# Patient Record
Sex: Male | Born: 1985 | Race: White | Hispanic: No | Marital: Married | Smoking: Former smoker
Health system: Southern US, Community
[De-identification: ages and names within clinical notes are randomized; demographics above are authoritative.]

## PROBLEM LIST (undated history)

## (undated) DIAGNOSIS — Z8489 Family history of other specified conditions: Secondary | ICD-10-CM

## (undated) DIAGNOSIS — R112 Nausea with vomiting, unspecified: Secondary | ICD-10-CM

## (undated) DIAGNOSIS — K219 Gastro-esophageal reflux disease without esophagitis: Secondary | ICD-10-CM

## (undated) DIAGNOSIS — G8929 Other chronic pain: Secondary | ICD-10-CM

## (undated) DIAGNOSIS — M549 Dorsalgia, unspecified: Secondary | ICD-10-CM

## (undated) DIAGNOSIS — F329 Major depressive disorder, single episode, unspecified: Secondary | ICD-10-CM

## (undated) DIAGNOSIS — F32A Depression, unspecified: Secondary | ICD-10-CM

## (undated) DIAGNOSIS — Z9889 Other specified postprocedural states: Secondary | ICD-10-CM

## (undated) HISTORY — PX: HERNIA REPAIR: SHX51

## (undated) HISTORY — PX: BACK SURGERY: SHX140

---

## 2014-06-17 ENCOUNTER — Ambulatory Visit: Payer: No Typology Code available for payment source | Attending: Family Medicine | Admitting: Physical Therapy

## 2014-06-17 DIAGNOSIS — Z87828 Personal history of other (healed) physical injury and trauma: Secondary | ICD-10-CM | POA: Diagnosis not present

## 2014-06-17 DIAGNOSIS — M545 Low back pain: Secondary | ICD-10-CM | POA: Diagnosis present

## 2014-06-17 DIAGNOSIS — M542 Cervicalgia: Secondary | ICD-10-CM | POA: Diagnosis not present

## 2014-06-19 ENCOUNTER — Ambulatory Visit: Payer: No Typology Code available for payment source | Admitting: Physical Therapy

## 2014-06-19 DIAGNOSIS — M545 Low back pain: Secondary | ICD-10-CM | POA: Diagnosis not present

## 2014-06-24 ENCOUNTER — Ambulatory Visit: Payer: No Typology Code available for payment source | Attending: Family Medicine | Admitting: *Deleted

## 2014-06-24 DIAGNOSIS — M545 Low back pain: Secondary | ICD-10-CM | POA: Diagnosis not present

## 2014-06-24 DIAGNOSIS — M542 Cervicalgia: Secondary | ICD-10-CM | POA: Diagnosis not present

## 2014-06-24 DIAGNOSIS — Z87828 Personal history of other (healed) physical injury and trauma: Secondary | ICD-10-CM | POA: Insufficient documentation

## 2014-06-26 ENCOUNTER — Encounter: Payer: No Typology Code available for payment source | Admitting: Physical Therapy

## 2014-06-29 ENCOUNTER — Encounter: Payer: No Typology Code available for payment source | Admitting: Physical Therapy

## 2014-06-30 ENCOUNTER — Encounter: Payer: No Typology Code available for payment source | Admitting: Physical Therapy

## 2015-05-03 ENCOUNTER — Encounter (HOSPITAL_COMMUNITY): Payer: Self-pay | Admitting: Emergency Medicine

## 2015-05-03 ENCOUNTER — Emergency Department (HOSPITAL_COMMUNITY)
Admission: EM | Admit: 2015-05-03 | Discharge: 2015-05-03 | Disposition: A | Discharge status: Home / Self Care | Payer: 59 | Attending: Emergency Medicine | Admitting: Emergency Medicine

## 2015-05-03 DIAGNOSIS — R51 Headache: Secondary | ICD-10-CM | POA: Insufficient documentation

## 2015-05-03 DIAGNOSIS — H109 Unspecified conjunctivitis: Secondary | ICD-10-CM | POA: Insufficient documentation

## 2015-05-03 DIAGNOSIS — H578 Other specified disorders of eye and adnexa: Secondary | ICD-10-CM | POA: Diagnosis present

## 2015-05-03 MED ORDER — TETRACAINE HCL 0.5 % OP SOLN
2.0000 [drp] | Freq: Once | OPHTHALMIC | Status: AC
  Administered 2015-05-03: 2 [drp] via OPHTHALMIC
  Filled 2015-05-03: qty 4

## 2015-05-03 MED ORDER — TOBRAMYCIN 0.3 % OP SOLN
1.0000 [drp] | Freq: Once | OPHTHALMIC | Status: AC
  Administered 2015-05-03: 1 [drp] via OPHTHALMIC
  Filled 2015-05-03: qty 5

## 2015-05-03 NOTE — ED Notes (Signed)
Pt reports bilat eye redness, itching, buning and matting. Pt also c/o headache.

## 2015-05-03 NOTE — ED Provider Notes (Signed)
CSN: [id number]     Arrival date & time 05/03/15  1129 History  By signing my name below, I, Placido Sou, attest that this documentation has been prepared under the direction and in the presence of Addisynn Vassell, PA-C. Electronically Signed: Placido Sou, ED Scribe. 05/03/2015. 1:26 PM.   Chief Complaint  Patient presents with  . Eye Problem   The history is provided by the patient. No language interpreter was used.    HPI Comments: Kyle Hoover is a 29 y.o. male who presents to the Emergency Department complaining of moderate, worsening, right eye irritation with onset 1 day ago. Pt describes the sensation as burning, itching  further noting associated discharge, eye watering, matting and redness with his left eye beginning to show similar symptoms this morning. He also notes an associated, mild, intermitting, frontal HA. He denies wearing prescription eyewear, recent illnesses or any recent eye trauma. Pt denies photophobia, fever, rhinorrhea, cough, sneezing or any other cold-like symptoms.   No past medical history on file. No past surgical history on file. No family history on file. Social History  Substance Use Topics  . Smoking status: Not on file  . Smokeless tobacco: Not on file  . Alcohol Use: Not on file    Review of Systems  HENT: Negative for congestion, postnasal drip, rhinorrhea, sneezing and sore throat.   Eyes: Positive for pain, discharge, redness and itching. Negative for photophobia and visual disturbance.  Neurological: Positive for headaches.   Allergies  Review of patient's allergies indicates not on file.  Home Medications   Prior to Admission medications   Not on File   BP 117/72 mmHg  Pulse 81  Temp(Src) 97.8 F (36.6 C) (Oral)  Resp 14  Ht 6' (1.829 m)  Wt 97.523 kg  BMI 29.15 kg/m2  SpO2 98% Physical Exam  Constitutional: He is oriented to person, place, and time. He appears well-developed and well-nourished.  HENT:  Head:  Normocephalic and atraumatic.  Right Ear: Hearing, tympanic membrane, external ear and ear canal normal.  Left Ear: Hearing, tympanic membrane, external ear and ear canal normal.  Mouth/Throat: Uvula is midline and oropharynx is clear and moist. No oropharyngeal exudate.  Eyes: EOM are normal. Lids are everted and swept, no foreign bodies found. Right eye exhibits discharge. Right eye exhibits no chemosis. Left eye exhibits discharge. Left eye exhibits no chemosis. Right conjunctiva is injected. Right conjunctiva has no hemorrhage. Left conjunctiva has no hemorrhage.  Slit lamp exam:      The right eye shows no corneal abrasion, no corneal ulcer and no fluorescein uptake.       The left eye shows no corneal abrasion.  Conjunctivae Injected on the right; mild bilateral exudate present; no ecchymosis   Neck: Normal range of motion. No tracheal deviation present.  No cervical lymphadenopathy   Cardiovascular: Normal rate and regular rhythm.   Pulmonary/Chest: Effort normal. No respiratory distress.  Musculoskeletal: Normal range of motion.  Neurological: He is alert and oriented to person, place, and time.  Skin: Skin is warm and dry. He is not diaphoretic.  Psychiatric: He has a normal mood and affect. His behavior is normal.  Nursing note and vitals reviewed.  ED Course  Procedures  DIAGNOSTIC STUDIES: Oxygen Saturation is 98% on RA, normal by my interpretation.    COORDINATION OF CARE: 12:42 PM Pt presents due to worsening eye irritation. Discussed next steps with pt including a fluorescin exam and reevaluation based on the findings. Pt agreed to plan.  SLIT LAMP EXAM: Tetracaine 2 drops used.  Lids everted and swept for exam, no evidence of foreign body.  Conjunctivae: Injected on the right Cornea: No evidence of abrasion.  EOM: Intact  Pupils: PERRL  Fluorescein uptake: negative   Patient tolerated procedure well without immediate complications.  Labs Review Labs Reviewed -  No data to display  Imaging Review No results found.   EKG Interpretation None      MDM                        Visual Acuity  Right Eye Distance: 20/30 Left Eye Distance: 20/30 Bilateral Distance: 20/15  Right Eye Near:   Left Eye Near:    Bilateral Near:  20/15  Final diagnoses:  Bilateral conjunctivitis    Pt is well appearing.  nml fundoscopic exam.  Dispensed tobramycin, pt agrees to warm compresses and OTC lubrication eye drops.  Referral given for ophtho  I personally performed the services described in this documentation, which was scribed in my presence. The recorded information has been reviewed and is accurate.    Pauline Ausammy Lear Carstens, PA-C 05/04/15 2141  Eber HongBrian Miller, MD 05/05/15 [phone]

## 2015-05-03 NOTE — Discharge Instructions (Signed)
Bacterial Conjunctivitis Bacterial conjunctivitis (commonly called pink eye) is redness, soreness, or puffiness (inflammation) of the white part of your eye. It is caused by a germ called bacteria. These germs can easily spread from person to person (contagious). Your eye often will become red or pink. Your eye may also become irritated, watery, or have a thick discharge.  HOME CARE   Apply a cool, clean washcloth over closed eyelids. Do this for 10-20 minutes, 3-4 times a day while you have pain.  Gently wipe away any fluid coming from the eye with a warm, wet washcloth or cotton ball.  Wash your hands often with soap and water. Use paper towels to dry your hands.  Do not share towels or washcloths.  Change or wash your pillowcase every day.  Do not use eye makeup until the infection is gone.  Do not use machines or drive if your vision is blurry.  Stop using contact lenses. Do not use them again until your doctor says it is okay.  Do not touch the tip of the eye drop bottle or medicine tube with your fingers when you put medicine on the eye. GET HELP RIGHT AWAY IF:   Your eye is not better after 3 days of starting your medicine.  You have a yellowish fluid coming out of the eye.  You have more pain in the eye.  Your eye redness is spreading.  Your vision becomes blurry.  You have a fever or lasting symptoms for more than 2-3 days.  You have a fever and your symptoms suddenly get worse.  You have pain in the face.  Your face gets red or puffy (swollen). MAKE SURE YOU:   Understand these instructions.  Will watch this condition.  Will get help right away if you are not doing well or get worse.   This information is not intended to replace advice given to you by your health care provider. Make sure you discuss any questions you have with your health care provider.   Document Released: 02/15/2008 Document Revised: 04/24/2012 Document Reviewed: 01/12/2012 Elsevier  Interactive Patient Education 2016 Elsevier Inc.  

## 2015-09-12 ENCOUNTER — Emergency Department (HOSPITAL_COMMUNITY)
Admission: EM | Admit: 2015-09-12 | Discharge: 2015-09-12 | Disposition: A | Discharge status: Home / Self Care | Payer: 59 | Attending: Emergency Medicine | Admitting: Emergency Medicine

## 2015-09-12 ENCOUNTER — Encounter (HOSPITAL_COMMUNITY): Payer: Self-pay | Admitting: Emergency Medicine

## 2015-09-12 DIAGNOSIS — M5440 Lumbago with sciatica, unspecified side: Secondary | ICD-10-CM | POA: Insufficient documentation

## 2015-09-12 DIAGNOSIS — M545 Low back pain: Secondary | ICD-10-CM | POA: Diagnosis present

## 2015-09-12 DIAGNOSIS — J020 Streptococcal pharyngitis: Secondary | ICD-10-CM | POA: Insufficient documentation

## 2015-09-12 DIAGNOSIS — Z79899 Other long term (current) drug therapy: Secondary | ICD-10-CM | POA: Diagnosis not present

## 2015-09-12 DIAGNOSIS — Z87891 Personal history of nicotine dependence: Secondary | ICD-10-CM | POA: Insufficient documentation

## 2015-09-12 LAB — RAPID STREP SCREEN (MED CTR MEBANE ONLY): STREPTOCOCCUS, GROUP A SCREEN (DIRECT): POSITIVE — AB

## 2015-09-12 MED ORDER — ONDANSETRON HCL 4 MG/2ML IJ SOLN
4.0000 mg | Freq: Once | INTRAMUSCULAR | Status: AC
  Administered 2015-09-12: 4 mg via INTRAVENOUS
  Filled 2015-09-12: qty 2

## 2015-09-12 MED ORDER — OXYCODONE-ACETAMINOPHEN 5-325 MG PO TABS: 1.0000 | ORAL_TABLET | ORAL | Status: DC | PRN

## 2015-09-12 MED ORDER — METHYLPREDNISOLONE SODIUM SUCC 125 MG IJ SOLR
125.0000 mg | Freq: Once | INTRAMUSCULAR | Status: AC
  Administered 2015-09-12: 125 mg via INTRAVENOUS
  Filled 2015-09-12: qty 2

## 2015-09-12 MED ORDER — HYDROMORPHONE HCL 1 MG/ML IJ SOLN
1.0000 mg | Freq: Once | INTRAMUSCULAR | Status: AC
  Administered 2015-09-12: 1 mg via INTRAVENOUS
  Filled 2015-09-12: qty 1

## 2015-09-12 MED ORDER — SODIUM CHLORIDE 0.9 % IV BOLUS (SEPSIS)
1000.0000 mL | Freq: Once | INTRAVENOUS | Status: AC
  Administered 2015-09-12: 1000 mL via INTRAVENOUS

## 2015-09-12 MED ORDER — PREDNISONE 50 MG PO TABS: ORAL_TABLET | ORAL | Status: DC

## 2015-09-12 MED ORDER — CYCLOBENZAPRINE HCL 10 MG PO TABS: 10.0000 mg | ORAL_TABLET | Freq: Two times a day (BID) | ORAL | Status: DC | PRN

## 2015-09-12 MED ORDER — KETOROLAC TROMETHAMINE 30 MG/ML IJ SOLN
30.0000 mg | Freq: Once | INTRAMUSCULAR | Status: AC
  Administered 2015-09-12: 30 mg via INTRAVENOUS
  Filled 2015-09-12: qty 1

## 2015-09-12 MED ORDER — PENICILLIN G BENZATHINE 1200000 UNIT/2ML IM SUSP
1.2000 10*6.[IU] | Freq: Once | INTRAMUSCULAR | Status: AC
  Administered 2015-09-12: 1.2 10*6.[IU] via INTRAMUSCULAR
  Filled 2015-09-12: qty 2

## 2015-09-12 NOTE — Discharge Instructions (Signed)
Your swab was positive for strep tonight. He received a shot of penicillin. Prescription for pain medicine, prednisone, muscle relaxer.  Follow-up with neurosurgeon

## 2015-09-12 NOTE — ED Notes (Signed)
Pt c/o bilateral lower back pain and sore throat. 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 Pt HR 126.

## 2015-09-12 NOTE — ED Provider Notes (Signed)
CSN: [id number]     Arrival date & time 09/12/15  1357 History   First MD Initiated Contact with Patient 09/12/15 1454     Chief Complaint  Patient presents with  . Back Pain     (Consider location/radiation/quality/duration/timing/severity/associated sxs/prior Treatment) HPI..... Back pain for several days. Patient has had 2 back surgeries in 2016 by Dr. Gerlene FeeKritzer.  First surgery in August, second surgery in December. Patient is uncertain exactly what surgery he had and information is not in EPIC. Additionally patient complains of sore throat and headache. No stiff neck. He is ambulatory. No bowel or bladder incontinence. 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 Severity is moderate.  Past Medical History  Diagnosis Date  . Back pain    Past Surgical History  Procedure Laterality Date  . Back surgery     Family History  Problem Relation Age of Onset  . Stroke Mother   . Heart failure Mother   . Asthma Mother   . Asthma Father   . Heart failure Father    Social History  Substance Use Topics  . Smoking status: Former Games developermoker  . Smokeless tobacco: Current User    Types: Snuff  . Alcohol Use: Yes     Comment: rarely    Review of Systems  All other systems reviewed and are negative.     Allergies  Review of patient's allergies indicates no known allergies.  Home Medications   Prior to Admission medications   Medication Sig Start Date End Date Taking? Authorizing Provider  cetirizine (ZYRTEC) 10 MG tablet Take 10 mg by mouth daily.   Yes Historical Provider, MD  nabumetone (RELAFEN) 500 MG tablet Take 500 mg by mouth 2 (two) times daily.   Yes Historical Provider, MD  omeprazole (PRILOSEC) 20 MG capsule Take 20 mg by mouth daily.   Yes Historical Provider, MD  cyclobenzaprine (FLEXERIL) 10 MG tablet Take 1 tablet (10 mg total) by mouth 2 (two) times daily as needed for muscle spasms. 09/12/15   Donnetta HutchingBrian Rigo Letts, MD  oxyCODONE-acetaminophen (PERCOCET) 5-325 MG  tablet Take 1-2 tablets by mouth every 4 (four) hours as needed. 09/12/15   Donnetta HutchingBrian Khloei Spiker, MD  predniSONE (DELTASONE) 50 MG tablet 1 tablet for 6 days, one half tablet for 6 days 09/12/15   Donnetta HutchingBrian Santosha Jividen, MD   BP 128/79 mmHg  Pulse 116  Temp(Src) 98.8 F (37.1 C) (Oral)  Resp 18  Ht 5\' 11"  (1.803 m)  Wt 217 lb (98.431 kg)  BMI 30.28 kg/m2  SpO2 98% Physical Exam  Constitutional: He is oriented to person, place, and time. He appears well-developed and well-nourished.  HENT:  Head: Normocephalic and atraumatic.  Oral pharyngeal area erythematous  Eyes: Conjunctivae and EOM are normal. Pupils are equal, round, and reactive to light.  Neck: Normal range of motion. Neck supple.  Cardiovascular: Normal rate and regular rhythm.   Pulmonary/Chest: Effort normal and breath sounds normal.  Abdominal: Soft. Bowel sounds are normal.  Musculoskeletal: Normal range of motion.  Paraspinous tenderness lumbar spine. Pain with straight leg raise bilaterally.  There is no evidence of an epidural abscess  Neurological: He is alert and oriented to person, place, and time.  Skin: Skin is warm and dry.  Psychiatric: He has a normal mood and affect. His behavior is normal.  Nursing note and vitals reviewed.   ED Course  Procedures (including critical care time) Labs Review Labs Reviewed  RAPID STREP SCREEN (NOT AT [facility]) - Abnormal; Notable for  the following:    Streptococcus, Group A Screen (Direct) POSITIVE (*)    All other components within normal limits    Imaging Review No results found. I have personally reviewed and evaluated these images and lab results as part of my medical decision-making.   EKG Interpretation None      MDM   Final diagnoses:  Low back pain, unspecified back pain laterality, with sciatica presence unspecified  Strep pharyngitis    Patient has had 2 back surgeries in 2016 on his lumbar spine. Rapid strep positive for strep. Rx Bicillin LA 1.2 million units IM.  Discharge medications percocet, prednisone, flexeril 10 mg.  Patient will follow-up with his neurosurgeon    Donnetta Hutching, MD 09/12/15 2122

## 2015-09-12 NOTE — ED Notes (Signed)
Pt states understanding of care given and follow up instructions.  Ambulated from ED  

## 2015-09-27 ENCOUNTER — Institutional Professional Consult (permissible substitution): Payer: 59 | Admitting: Neurology

## 2015-10-07 ENCOUNTER — Institutional Professional Consult (permissible substitution): Payer: 59 | Admitting: Neurology

## 2015-10-21 ENCOUNTER — Encounter: Payer: Self-pay | Admitting: Neurology

## 2015-10-21 ENCOUNTER — Ambulatory Visit (INDEPENDENT_AMBULATORY_CARE_PROVIDER_SITE_OTHER): Payer: 59 | Admitting: Neurology

## 2015-10-21 VITALS — BP 130/88 | HR 86 | Resp 20 | Ht 71.0 in | Wt 206.0 lb

## 2015-10-21 DIAGNOSIS — R51 Headache: Secondary | ICD-10-CM | POA: Diagnosis not present

## 2015-10-21 DIAGNOSIS — G4733 Obstructive sleep apnea (adult) (pediatric): Secondary | ICD-10-CM

## 2015-10-21 DIAGNOSIS — G4711 Idiopathic hypersomnia with long sleep time: Secondary | ICD-10-CM | POA: Insufficient documentation

## 2015-10-21 DIAGNOSIS — R519 Headache, unspecified: Secondary | ICD-10-CM

## 2015-10-21 NOTE — Addendum Note (Signed)
Addended by: Melvyn NovasHMEIER, Sherrica Niehaus on: 10/21/2015 03:49 PM   Modules accepted: Orders

## 2015-10-21 NOTE — Progress Notes (Signed)
SLEEP MEDICINE CLINIC   Provider:  Melvyn Novas, M D  Referring Provider: Lianne Moris, PA-C Primary Care Physician:  No primary care provider on file.  Chief Complaint  Patient presents with  . New Patient (Initial Visit)    snores at night, never had sleep study, rm 10, with wife    HPI:  Kyle Hoover is a 30 y.o. male , seen here as a referral from [place]   [facility] for Evaluation of non-restorative non-refreshing sleep.   Chief complaint according to patient : Kyle Hoover reports that he has some nights gotten " enough sleep"- more than 8 hours - and still wakes up not feeling as if he had slept sufficiently. He feels fatigued in daytime he has also trouble that his sleep feels restless and disrupted or fragmented. He is highly fatigued in daytime. Kyle Hoover reports that he usually works in daytime sometimes longer hours but he is not working overnight or third shift. 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  The patient takes currently the following medications, omeprazole 1 capsule in the evening which started on 09/17/2015.  Sleep habits are as follows: He reports feeling a lot in fatigue when he comes home after work. He feels physically exhausted. He usually returns home between 5 and 6 PM, and he feels ready to sleep. When he is not mentally stimulated or physically active his fatigue is even more noticeable. He usually needs about half an hour in bed before he can enter sleep, and goes to his bedroom around 10 PM. He turns over but he doesn't really wake up for period of time at night. He does not have bathroom breaks at night, his wife reports that he snores loudly every night which keeps her awake. He may stop snoring for couple of minutes and then has a very staggering irregular breathing rhythm. The bedroom is described as core, quiet and dark, and the couple shares the bed. The 49-year-old boy sometimes will be in the same bed. [name] sleeps usually on one pillow, and he usually prefers supine sleep but has already noted that he has more apnea or snoring in that position. Alternatively, he likes to sleep prone. The patient rises usually around 5 AM and relies on an alarm., after an estimated overnight sleep time of 6-7 hours. He has reported morning headaches in the morning.    Sleep medical history and family sleep history:  Kyle Hoover suffers from obstructive sleep apnea was diagnosed. He he has been prescribed a CPAP .  Social history:  Married with a toddler son, he uses dip tobacco, snuff tobacco. He drinks very rarely alcohol, he drinks water and avoids caffeinated beverages.   Review of Systems: Out of a complete 14 system review, the patient complains of only the following symptoms, and all other reviewed systems are negative.   Epworth score  12 , Fatigue severity score 40  , depression score 2/15   Social History   Social History  . Marital Status: Married    Spouse Name: N/A  . Number of Children: N/A  . Years of Education: N/A   Occupational History  . Not on file.   Social History Main Topics  . Smoking status: Former Games developer  . Smokeless tobacco: Current User    Types: Snuff  . Alcohol Use: Yes     Comment: rarely  . Drug Use: No  . Sexual Activity: Not on file   Other  Topics Concern  . Not on file   Social History Narrative    Family History  Problem Relation Age of Onset  . Stroke Mother   . Heart failure Mother   . Asthma Mother   . Asthma Father   . Heart failure Father     Past Medical History  Diagnosis Date  . Back pain     Past Surgical History  Procedure Laterality Date  . Back surgery      Current Outpatient Prescriptions  Medication Sig Dispense Refill  . cetirizine (ZYRTEC) 10 MG tablet Take 10 mg by mouth daily.    Marland Kitchen omeprazole (PRILOSEC) 20 MG capsule Take 20 mg by mouth daily.     No current facility-administered  medications for this visit.    Allergies as of 10/21/2015  . (No Known Allergies)    Vitals: BP 130/88 mmHg  Pulse 86  Resp 20  Ht  (1.803 m)  Wt 206 lb (93.441 kg)  BMI 28.74 kg/m2 Last Weight:  Wt Readings from Last 1 Encounters:  10/21/15 206 lb (93.441 kg)   ZOX:WRUE mass index is 28.74 kg/(m^2).     Last Height:   Ht Readings from Last 1 Encounters:  10/21/15  (1.803 m)    Physical exam:  General: The patient is awake, alert and appears not in acute distress. The patient is well groomed. Head: Normocephalic, atraumatic. Neck is supple. Mallampati 4  17.5    inches of neck circumference Nasal airflow patent ,  Retrognathia is not seen.  Cardiovascular:  Regular rate and rhythm  without  murmurs or carotid bruit, and without distended neck veins. Respiratory: Lungs are clear to auscultation. Skin:  Without evidence of edema, or rash Trunk: The patient's posture is erect   Neurologic exam : The patient is awake and alert, oriented to place and time.   Memory subjective described as intact.  Attention span & concentration ability appears normal.  Speech is fluent,  without   dysarthria, dysphonia or aphasia.  Mood and affect are appropriate.  Cranial nerves: Pupils are equal and briskly reactive to light. Funduscopic exam without  evidence of pallor or edema. Extraocular movements  in vertical and horizontal planes intact and without nystagmus. Visual fields by finger perimetry are intact.Hearing to finger rub intact. Facial sensation intact to fine touch.Facial motor strength is symmetric and tongue and uvula move midline. Shoulder shrug was symmetrical.  Motor exam:  Normal tone, muscle bulk and symmetric strength in all extremities. Sensory:  Fine touch, pinprick and vibration were tested in all extremities. Proprioception tested in the upper extremities was normal. Coordination: Rapid alternating movements in the fingers/hands was normal. Finger-to-nose  maneuver  normal without evidence of ataxia, dysmetria or tremor. Gait and station: Patient walks without assistive device and is able unassisted to climb up to the exam table. Strength within normal limits. Stance is stable and normal.   Deep tendon reflexes: in the  upper and lower extremities are symmetric and intact.   The patient was advised of the nature of the diagnosed sleep disorder , the treatment options and risks for general a health and wellness arising from not treating the condition.  I spent more than 40 minutes of face to face time with the patient. Greater than 50% of time was spent in counseling and coordination of care. We have discussed the diagnosis and differential and I answered the patient's questions.     Assessment:  After physical and neurologic examination, review of  laboratory studies,  Personal review of imaging studies, reports of other /same  Imaging studies ,  Results of polysomnography/ neurophysiology testing and pre-existing records as far as provided in visit., my assessment is   1) Kyle Hoover's wife has reported that he has snoring and she has witnessed apneas by description. He does have a high-grade Mallampati and a larger neck circumference, his overall body mass index is elevated but she is by no means morbidly obese. He is also of muscular build. He does not have abnormalities of heart rhythm, blood pressure, and there are no bruits. His excessive daytime sleepiness is in contrast to needing a little time to fall asleep when he is finally at home and expected to go to bed. He attributes this partially to the physical exhaustion and feels sometimes most tired when driving home. With the above description would like this patient to attend a split-night polysomnography with the ultimate goal to diagnose the degree of sleep apnea and the best associated treatment options. If hypoxemia or hypercapnia are not present this patient would be a fine candidate for a  dental device. I will order a attended sleep study as I want to have a capnography parallel please note that the patient has reported morning headaches after sleep, but not episodic cluster headaches that wake him out of sleep. He is excessively fatigued which also can be related to CO2 retention. He does not report vivid dreams dream intrusion or  Hallucinations, there is no evidence of cataplexy either. Based on this description I would not need him to be evaluated for narcolepsy.   Plan:  Treatment plan and additional workup :  SPLIT night  with capnography.   RV after sleep study.     Porfirio Mylararmen Keelin Neville MD  10/21/2015   CC: [facility]    Lianne MorisErin Carroll, Pa-c [address] [place], [place] [id number]

## 2015-11-18 ENCOUNTER — Encounter (INDEPENDENT_AMBULATORY_CARE_PROVIDER_SITE_OTHER): Payer: 59 | Admitting: Neurology

## 2015-11-18 DIAGNOSIS — G4733 Obstructive sleep apnea (adult) (pediatric): Secondary | ICD-10-CM

## 2015-12-03 ENCOUNTER — Telehealth: Payer: Self-pay | Admitting: Neurology

## 2015-12-03 NOTE — Telephone Encounter (Signed)
Pt's wife called and states he  has had study but has not been called about results.Please call

## 2015-12-06 NOTE — Telephone Encounter (Signed)
I spoke to pt and advised him that his sleep study revealed snoring, but did not reveal significant apnea. I advised pt that the snoring can be addressed by a dental device but the pt declined any therapy for his snoring at this time. I advised pt that weight loss is recommended. Pt verbalized understanding of results. Pt declined a follow up appt with Dr. Vickey Hugerohmeier at this time. Pt asked that I fax a copy of the sleep study to Lianne MorisErin Carroll, PA (referring provider). Pt had no questions at this time but was encouraged to call back if questions arise.

## 2015-12-07 ENCOUNTER — Other Ambulatory Visit: Payer: Self-pay

## 2015-12-07 DIAGNOSIS — R51 Headache: Secondary | ICD-10-CM

## 2015-12-07 DIAGNOSIS — G4711 Idiopathic hypersomnia with long sleep time: Secondary | ICD-10-CM

## 2015-12-07 DIAGNOSIS — R519 Headache, unspecified: Secondary | ICD-10-CM

## 2015-12-07 DIAGNOSIS — G4733 Obstructive sleep apnea (adult) (pediatric): Secondary | ICD-10-CM

## 2016-08-29 ENCOUNTER — Other Ambulatory Visit: Payer: Self-pay | Admitting: Neurological Surgery

## 2016-08-29 ENCOUNTER — Telehealth: Payer: Self-pay | Admitting: Surgery

## 2016-08-29 NOTE — Telephone Encounter (Signed)
-----   Message from Phillips Odor, RN sent at 08/29/2016  4:45 PM EDT ----- Regarding: needs appt. with Dr. Myra Gianotti Please schedule for a new pt. consult with Dr. Myra Gianotti prior to ALIF scheduled 09/29/16; please remind pt. To bring copy of LS spine film to appt.

## 2016-08-29 NOTE — Telephone Encounter (Signed)
Scheduled 5/2 @ 3pm. Left message to confirm.

## 2016-08-31 ENCOUNTER — Other Ambulatory Visit: Payer: Self-pay

## 2016-09-13 ENCOUNTER — Encounter: Payer: Self-pay | Admitting: Surgery

## 2016-09-18 NOTE — Pre-Procedure Instructions (Signed)
Harim Bi  09/18/2016      [facility] - [place], [place] - [address] [place] [address] Phone: [phone] Fax: [phone]  [facility] [address] [place], [place] [address] [phone] [place] [address] Phone: [phone] Fax: [phone]    Your procedure is scheduled on Fri, May 11 @ 7:30 AM  Report to [facility] Admitting at 5:30 AM  Call this number if you have problems the morning of surgery:  [phone]   Remember:  Do not eat food or drink liquids after midnight.  Take these medicines the morning of surgery with A SIP OF WATER Zyrtec(Cetirizine),Pain Pill(if needed),and Omeprazole(Protonix)              No Goody's,BC's,Aleve,Advil,Motrin,Ibuprofen,Aspirin,Fish Oil,or any Herbal Medications.    Do not wear jewelry.  Do not wear lotions, powders,colognes, or deoderant.  Men may shave face and neck.  Do not bring valuables to the hospital.  [facility] is not responsible for any belongings or valuables.  Contacts, dentures or bridgework may not be worn into surgery.  Leave your suitcase in the car.  After surgery it may be brought to your room.  For patients admitted to the hospital, discharge time will be determined by your treatment team.  Patients discharged the day of surgery will not be allowed to drive home.    [facility]  Before surgery, you can play an important role.  Because skin is not sterile, your skin needs to be as free of germs as possible.  You can reduce the number of germs on you skin by washing with CHG (chlorahexidine gluconate) soap before surgery.  CHG is an antiseptic cleaner which kills germs and bonds with the skin to continue killing germs even after washing.  Please DO NOT use if you have an allergy to CHG or antibacterial soaps.  If your skin becomes reddened/irritated stop using the CHG and inform your nurse when you arrive at Short  Stay.  Do not shave (including legs and underarms) for at least 48 hours prior to the first CHG shower.  You may shave your face.  Please follow these instructions carefully:   1.  Shower with CHG Soap the night before surgery and the                                morning of Surgery.  2.  If you choose to wash your hair, wash your hair first as usual with your       normal shampoo.  3.  After you shampoo, rinse your hair and body thoroughly to remove the                      Shampoo.  4.  Use CHG as you would any other liquid soap.  You can apply chg directly       to the skin and wash gently with scrungie or a clean washcloth.  5.  Apply the CHG Soap to your body ONLY FROM THE NECK DOWN.        Do not use on open wounds or open sores.  Avoid contact with your eyes,       ears, mouth and genitals (private parts).  Wash genitals (private parts)       with your normal soap.  6.  Wash  thoroughly, paying special attention to the area where your surgery        will be performed.  7.  Thoroughly rinse your body with warm water from the neck down.  8.  DO NOT shower/wash with your normal soap after using and rinsing off       the CHG Soap.  9.  Pat yourself dry with a clean towel.            10.  Wear clean pajamas.            11.  Place clean sheets on your bed the night of your first shower and do not        sleep with pets.  Day of Surgery  Do not apply any lotions/deoderants the morning of surgery.  Please wear clean clothes to the hospital/surgery center.    Please read over the following fact sheets that you were given. Pain Booklet, Coughing and Deep Breathing, MRSA Information and Surgical Site Infection Prevention

## 2016-09-19 ENCOUNTER — Encounter (HOSPITAL_COMMUNITY): Payer: Self-pay

## 2016-09-19 ENCOUNTER — Ambulatory Visit (HOSPITAL_COMMUNITY)
Admission: RE | Admit: 2016-09-19 | Discharge: 2016-09-19 | Disposition: A | Discharge status: Home / Self Care | Payer: 59 | Source: Ambulatory Visit | Attending: Neurological Surgery | Admitting: Neurological Surgery

## 2016-09-19 ENCOUNTER — Encounter (HOSPITAL_COMMUNITY)
Admission: RE | Admit: 2016-09-19 | Discharge: 2016-09-19 | Disposition: A | Discharge status: Home / Self Care | Payer: 59 | Source: Ambulatory Visit | Attending: Neurological Surgery | Admitting: Neurological Surgery

## 2016-09-19 DIAGNOSIS — M5126 Other intervertebral disc displacement, lumbar region: Secondary | ICD-10-CM | POA: Insufficient documentation

## 2016-09-19 DIAGNOSIS — Z01812 Encounter for preprocedural laboratory examination: Secondary | ICD-10-CM | POA: Insufficient documentation

## 2016-09-19 DIAGNOSIS — Z01810 Encounter for preprocedural cardiovascular examination: Secondary | ICD-10-CM | POA: Insufficient documentation

## 2016-09-19 HISTORY — DX: Gastro-esophageal reflux disease without esophagitis: K21.9

## 2016-09-19 HISTORY — DX: Other chronic pain: G89.29

## 2016-09-19 HISTORY — DX: Dorsalgia, unspecified: M54.9

## 2016-09-19 HISTORY — DX: Family history of other specified conditions: Z84.89

## 2016-09-19 LAB — CBC WITH DIFFERENTIAL/PLATELET
BASOS ABS: 0 10*3/uL (ref 0.0–0.1)
BASOS PCT: 1 %
EOS PCT: 4 %
Eosinophils Absolute: 0.3 10*3/uL (ref 0.0–0.7)
HEMATOCRIT: 45.9 % (ref 39.0–52.0)
Hemoglobin: 15.9 g/dL (ref 13.0–17.0)
Lymphocytes Relative: 26 %
Lymphs Abs: 1.6 10*3/uL (ref 0.7–4.0)
MCH: 30.6 pg (ref 26.0–34.0)
MCHC: 34.6 g/dL (ref 30.0–36.0)
MCV: 88.4 fL (ref 78.0–100.0)
MONO ABS: 0.5 10*3/uL (ref 0.1–1.0)
MONOS PCT: 8 %
Neutro Abs: 3.7 10*3/uL (ref 1.7–7.7)
Neutrophils Relative %: 61 %
PLATELETS: 283 10*3/uL (ref 150–400)
RBC: 5.19 MIL/uL (ref 4.22–5.81)
RDW: 12.3 % (ref 11.5–15.5)
WBC: 6 10*3/uL (ref 4.0–10.5)

## 2016-09-19 LAB — BASIC METABOLIC PANEL
ANION GAP: 7 (ref 5–15)
BUN: 13 mg/dL (ref 6–20)
CALCIUM: 9.4 mg/dL (ref 8.9–10.3)
CO2: 23 mmol/L (ref 22–32)
CREATININE: 0.81 mg/dL (ref 0.61–1.24)
Chloride: 108 mmol/L (ref 101–111)
GFR calc Af Amer: >60 mL/min (ref >60)
GLUCOSE: 95 mg/dL (ref 65–99)
POTASSIUM: 4.4 mmol/L (ref 3.5–5.1)
SODIUM: 138 mmol/L (ref 135–145)

## 2016-09-19 LAB — TYPE AND SCREEN
ABO/RH(D): A POS
ANTIBODY SCREEN: NEGATIVE

## 2016-09-19 LAB — SURGICAL PCR SCREEN
MRSA, PCR: NEGATIVE
STAPHYLOCOCCUS AUREUS: POSITIVE — AB

## 2016-09-19 LAB — PROTIME-INR
INR: 1
Prothrombin Time: 13.2 seconds (ref 11.4–15.2)

## 2016-09-19 LAB — ABO/RH: ABO/RH(D): A POS

## 2016-09-19 MED ORDER — CHLORHEXIDINE GLUCONATE 4 % EX LIQD: 60.0000 mL | Freq: Once | CUTANEOUS | Status: DC

## 2016-09-19 MED ORDER — CHLORHEXIDINE GLUCONATE CLOTH 2 % EX PADS: 6.0000 | MEDICATED_PAD | Freq: Once | CUTANEOUS | Status: DC

## 2016-09-19 NOTE — Progress Notes (Signed)
Cardiologist denies  Medical Md is with [facility]  Echo denies  Stress test denies  Heart cath denies

## 2016-09-19 NOTE — Progress Notes (Signed)
Mupirocin script called into [facility] in [place].

## 2016-09-20 ENCOUNTER — Ambulatory Visit (INDEPENDENT_AMBULATORY_CARE_PROVIDER_SITE_OTHER): Payer: 59 | Admitting: Surgery

## 2016-09-20 ENCOUNTER — Encounter: Payer: Self-pay | Admitting: Surgery

## 2016-09-20 VITALS — BP 112/77 | HR 82 | Temp 97.5°F | Resp 20 | Ht 71.0 in | Wt 207.5 lb

## 2016-09-20 DIAGNOSIS — M5440 Lumbago with sciatica, unspecified side: Secondary | ICD-10-CM

## 2016-09-20 NOTE — Progress Notes (Signed)
[facility] and [facility] Specialist of [place]  Patient name: Kyle Hoover MRN: [id number] DOB: [birth year] Sex: male   REFERRING PROVIDER:    Dr. Yetta Barre   REASON FOR CONSULT:    Back pain  HISTORY OF PRESENT ILLNESS:   Kyle Hoover is a 31 y.o. male, who is Referred today for discussions regarding anterior exposure for back instrumentation at the L5-S1 disc space.  The patient has previous undergone microdiscectomy.  Since October of last year he has had a return of bilateral radicular pain.  Instrumentation at the L5-S1 disc space has been recommended.  The patient denies having had any abdominal surgery.  He does take allergy medication and medication for reflux.  He has a history of tobacco abuse.  PAST MEDICAL HISTORY    Past Medical History:  Diagnosis Date  . Chronic back pain   . Family history of adverse reaction to anesthesia    dad gets sick  . GERD (gastroesophageal reflux disease)    takes Omeprazole daily     FAMILY HISTORY   Family History  Problem Relation Age of Onset  . Stroke Mother   . Heart failure Mother   . Asthma Mother   . Asthma Father   . Heart failure Father     SOCIAL HISTORY:   Social History   Social History  . Marital status: Married    Spouse name: N/A  . Number of children: N/A  . Years of education: N/A   Occupational History  . Not on file.   Social History Main Topics  . Smoking status: Former Games developer  . Smokeless tobacco: Current User    Types: Snuff     Comment: quit smoking 11 yrs ago  . Alcohol use No  . Drug use: No  . Sexual activity: Not on file   Other Topics Concern  . Not on file   Social History Narrative  . No narrative on file    ALLERGIES:    No Known Allergies  CURRENT MEDICATIONS:    Current Outpatient Prescriptions  Medication Sig Dispense Refill  . cetirizine (ZYRTEC) 10 MG tablet Take 10 mg by mouth daily as needed for allergies.     Marland Kitchen  HYDROcodone-acetaminophen (NORCO/VICODIN) 5-325 MG tablet Take 1 tablet by mouth at bedtime as needed for moderate pain.    Marland Kitchen omeprazole (PRILOSEC) 20 MG capsule Take 40 mg by mouth daily.      No current facility-administered medications for this visit.     REVIEW OF SYSTEMS:    denotes positive finding,  denotes negative finding Cardiac  Comments:  Chest pain or chest pressure:    Shortness of breath upon exertion:    Short of breath when lying flat:    Irregular heart rhythm:        Vascular    Pain in calf, thigh, or hip brought on by ambulation:    Pain in feet at night that wakes you up from your sleep:     Blood clot in your veins:    Leg swelling:         Pulmonary    Oxygen at home:    Productive cough:     Wheezing:         Neurologic    Sudden weakness in arms or legs:     Sudden numbness in arms or legs:     Sudden onset of difficulty speaking or slurred speech:    Temporary loss of vision in one eye:  Problems with dizziness:         Gastrointestinal    Blood in stool:      Vomited blood:         Genitourinary    Burning when urinating:     Blood in urine:        Psychiatric    Major depression:         Hematologic    Bleeding problems:    Problems with blood clotting too easily:        Skin    Rashes or ulcers:        Constitutional    Fever or chills:     PHYSICAL EXAM:   Vitals:   09/20/16 1440  BP: 112/77  Pulse: 82  Resp: 20  Temp: 97.5 F (36.4 C)  TempSrc: Oral  SpO2: 98%  Weight: 207 lb 8 oz (94.1 kg)  Height:  (1.803 m)    GENERAL: The patient is a well-nourished male, in no acute distress. The vital signs are documented above. CARDIAC: There is a regular rate and rhythm.  VASCULAR: Palpable pedal pulses bilaterally.  No significant edema PULMONARY: Nonlabored respirations MUSCULOSKELETAL: There are no major deformities or cyanosis. NEUROLOGIC: No focal weakness or paresthesias are detected. SKIN: There  are no ulcers or rashes noted. PSYCHIATRIC: The patient has a normal affect.  STUDIES:   none  ASSESSMENT and PLAN   Degenerative back pain: The patient is scheduled for anterior instrumentation of the L5-S1 disc space.  I have been asked to provide anterior exposure.  I discussed the details of the procedure with the patient including the incision, as well as the risk of injury to the iliac artery, iliac vein, and ureter.  We also discussed the risk of retrograde ejaculation.  The patient understands these and wishes to proceed.   Durene Cal, MD [facility] [phone] Pager [phone]

## 2016-09-29 ENCOUNTER — Inpatient Hospital Stay (HOSPITAL_COMMUNITY): Payer: 59 | Admitting: Certified Registered"

## 2016-09-29 ENCOUNTER — Encounter (HOSPITAL_COMMUNITY): Payer: Self-pay | Admitting: *Deleted

## 2016-09-29 ENCOUNTER — Inpatient Hospital Stay (HOSPITAL_COMMUNITY): Payer: 59

## 2016-09-29 ENCOUNTER — Encounter (HOSPITAL_COMMUNITY)
Admission: RE | Disposition: A | Discharge status: Home / Self Care | Payer: Self-pay | Source: Ambulatory Visit | Attending: Neurological Surgery

## 2016-09-29 ENCOUNTER — Inpatient Hospital Stay (HOSPITAL_COMMUNITY)
Admission: RE | Admit: 2016-09-29 | Discharge: 2016-09-30 | DRG: 460 | Disposition: A | Discharge status: Home / Self Care | Payer: 59 | Source: Ambulatory Visit | Attending: Neurological Surgery | Admitting: Neurological Surgery

## 2016-09-29 DIAGNOSIS — G4730 Sleep apnea, unspecified: Secondary | ICD-10-CM | POA: Diagnosis present

## 2016-09-29 DIAGNOSIS — M5127 Other intervertebral disc displacement, lumbosacral region: Secondary | ICD-10-CM | POA: Diagnosis present

## 2016-09-29 DIAGNOSIS — M79606 Pain in leg, unspecified: Secondary | ICD-10-CM | POA: Diagnosis present

## 2016-09-29 DIAGNOSIS — Z79899 Other long term (current) drug therapy: Secondary | ICD-10-CM

## 2016-09-29 DIAGNOSIS — K219 Gastro-esophageal reflux disease without esophagitis: Secondary | ICD-10-CM | POA: Diagnosis present

## 2016-09-29 DIAGNOSIS — Z720 Tobacco use: Secondary | ICD-10-CM | POA: Diagnosis not present

## 2016-09-29 DIAGNOSIS — Z981 Arthrodesis status: Secondary | ICD-10-CM

## 2016-09-29 DIAGNOSIS — Z419 Encounter for procedure for purposes other than remedying health state, unspecified: Secondary | ICD-10-CM

## 2016-09-29 DIAGNOSIS — M5117 Intervertebral disc disorders with radiculopathy, lumbosacral region: Secondary | ICD-10-CM

## 2016-09-29 HISTORY — DX: Other specified postprocedural states: Z98.890

## 2016-09-29 HISTORY — PX: ANTERIOR LUMBAR FUSION: SHX1170

## 2016-09-29 HISTORY — PX: ABDOMINAL EXPOSURE: SHX5708

## 2016-09-29 HISTORY — DX: Nausea with vomiting, unspecified: R11.2

## 2016-09-29 SURGERY — ANTERIOR LUMBAR FUSION 1 LEVEL
Anesthesia: General | Site: Abdomen

## 2016-09-29 MED ORDER — METHOCARBAMOL 1000 MG/10ML IJ SOLN
500.0000 mg | Freq: Four times a day (QID) | INTRAVENOUS | Status: DC | PRN
  Filled 2016-09-29: qty 5

## 2016-09-29 MED ORDER — PROPOFOL 10 MG/ML IV BOLUS
INTRAVENOUS | Status: DC | PRN
  Administered 2016-09-29: 130 mg via INTRAVENOUS

## 2016-09-29 MED ORDER — 0.9 % SODIUM CHLORIDE (POUR BTL) OPTIME
TOPICAL | Status: DC | PRN
  Administered 2016-09-29: 1000 mL

## 2016-09-29 MED ORDER — HYDROMORPHONE HCL 1 MG/ML IJ SOLN
INTRAMUSCULAR | Status: AC
  Filled 2016-09-29: qty 0.5

## 2016-09-29 MED ORDER — THROMBIN 20000 UNITS EX SOLR
CUTANEOUS | Status: AC
  Filled 2016-09-29: qty 20000

## 2016-09-29 MED ORDER — EPHEDRINE SULFATE 50 MG/ML IJ SOLN
INTRAMUSCULAR | Status: DC | PRN
  Administered 2016-09-29: 10 mg via INTRAVENOUS

## 2016-09-29 MED ORDER — PROPOFOL 10 MG/ML IV BOLUS
INTRAVENOUS | Status: AC
  Filled 2016-09-29: qty 20

## 2016-09-29 MED ORDER — MEPERIDINE HCL 25 MG/ML IJ SOLN: 6.2500 mg | INTRAMUSCULAR | Status: DC | PRN

## 2016-09-29 MED ORDER — SODIUM CHLORIDE 0.9 % IV SOLN: 250.0000 mL | INTRAVENOUS | Status: DC

## 2016-09-29 MED ORDER — PHENOL 1.4 % MT LIQD: 1.0000 | OROMUCOSAL | Status: DC | PRN

## 2016-09-29 MED ORDER — POTASSIUM CHLORIDE IN NACL 20-0.9 MEQ/L-% IV SOLN: INTRAVENOUS | Status: DC

## 2016-09-29 MED ORDER — ONDANSETRON HCL 4 MG/2ML IJ SOLN: 4.0000 mg | Freq: Once | INTRAMUSCULAR | Status: DC | PRN

## 2016-09-29 MED ORDER — ROCURONIUM BROMIDE 100 MG/10ML IV SOLN
INTRAVENOUS | Status: DC | PRN
  Administered 2016-09-29 (×2): 10 mg via INTRAVENOUS
  Administered 2016-09-29: 50 mg via INTRAVENOUS
  Administered 2016-09-29: 20 mg via INTRAVENOUS
  Administered 2016-09-29: 10 mg via INTRAVENOUS

## 2016-09-29 MED ORDER — THROMBIN 5000 UNITS EX SOLR
OROMUCOSAL | Status: DC | PRN
  Administered 2016-09-29: 09:00:00 via TOPICAL

## 2016-09-29 MED ORDER — MIDAZOLAM HCL 2 MG/2ML IJ SOLN
INTRAMUSCULAR | Status: AC
  Filled 2016-09-29: qty 2

## 2016-09-29 MED ORDER — SODIUM CHLORIDE 0.9 % IR SOLN
Status: DC | PRN
  Administered 2016-09-29: 09:00:00

## 2016-09-29 MED ORDER — LACTATED RINGERS IV SOLN
INTRAVENOUS | Status: DC | PRN
  Administered 2016-09-29: 07:00:00 via INTRAVENOUS

## 2016-09-29 MED ORDER — MIDAZOLAM HCL 5 MG/5ML IJ SOLN
INTRAMUSCULAR | Status: DC | PRN
  Administered 2016-09-29: 2 mg via INTRAVENOUS

## 2016-09-29 MED ORDER — FENTANYL CITRATE (PF) 250 MCG/5ML IJ SOLN
INTRAMUSCULAR | Status: AC
  Filled 2016-09-29: qty 5

## 2016-09-29 MED ORDER — SODIUM CHLORIDE 0.9% FLUSH
3.0000 mL | Freq: Two times a day (BID) | INTRAVENOUS | Status: DC
  Administered 2016-09-29: 3 mL via INTRAVENOUS

## 2016-09-29 MED ORDER — LIDOCAINE HCL (CARDIAC) 20 MG/ML IV SOLN
INTRAVENOUS | Status: DC | PRN
  Administered 2016-09-29: 100 mg via INTRAVENOUS

## 2016-09-29 MED ORDER — ACETAMINOPHEN 325 MG PO TABS
650.0000 mg | ORAL_TABLET | ORAL | Status: DC | PRN
  Administered 2016-09-29: 650 mg via ORAL
  Filled 2016-09-29: qty 2

## 2016-09-29 MED ORDER — FENTANYL CITRATE (PF) 100 MCG/2ML IJ SOLN
INTRAMUSCULAR | Status: DC | PRN
  Administered 2016-09-29: 250 ug via INTRAVENOUS
  Administered 2016-09-29: 50 ug via INTRAVENOUS

## 2016-09-29 MED ORDER — MORPHINE SULFATE (PF) 4 MG/ML IV SOLN: 2.0000 mg | INTRAVENOUS | Status: DC | PRN

## 2016-09-29 MED ORDER — HYDROMORPHONE HCL 1 MG/ML IJ SOLN
INTRAMUSCULAR | Status: AC
  Administered 2016-09-29: 0.5 mg via INTRAVENOUS
  Filled 2016-09-29: qty 1

## 2016-09-29 MED ORDER — CEFAZOLIN SODIUM-DEXTROSE 2-4 GM/100ML-% IV SOLN
2.0000 g | INTRAVENOUS | Status: AC
  Administered 2016-09-29: 2 g via INTRAVENOUS
  Filled 2016-09-29: qty 100

## 2016-09-29 MED ORDER — SENNA 8.6 MG PO TABS
1.0000 | ORAL_TABLET | Freq: Two times a day (BID) | ORAL | Status: DC
  Administered 2016-09-29 – 2016-09-30 (×3): 8.6 mg via ORAL
  Filled 2016-09-29 (×3): qty 1

## 2016-09-29 MED ORDER — OXYCODONE HCL 5 MG PO TABS
5.0000 mg | ORAL_TABLET | ORAL | Status: DC | PRN
  Administered 2016-09-29: 10 mg via ORAL
  Administered 2016-09-29: 5 mg via ORAL
  Administered 2016-09-29 – 2016-09-30 (×3): 10 mg via ORAL
  Filled 2016-09-29 (×4): qty 2
  Filled 2016-09-29: qty 1

## 2016-09-29 MED ORDER — ACETAMINOPHEN 650 MG RE SUPP: 650.0000 mg | RECTAL | Status: DC | PRN

## 2016-09-29 MED ORDER — ONDANSETRON HCL 4 MG/2ML IJ SOLN: 4.0000 mg | Freq: Four times a day (QID) | INTRAMUSCULAR | Status: DC | PRN

## 2016-09-29 MED ORDER — METHOCARBAMOL 500 MG PO TABS
500.0000 mg | ORAL_TABLET | Freq: Four times a day (QID) | ORAL | Status: DC | PRN
  Administered 2016-09-29 – 2016-09-30 (×4): 500 mg via ORAL
  Filled 2016-09-29 (×4): qty 1

## 2016-09-29 MED ORDER — CEFAZOLIN SODIUM-DEXTROSE 2-4 GM/100ML-% IV SOLN
2.0000 g | Freq: Three times a day (TID) | INTRAVENOUS | Status: AC
  Administered 2016-09-29 (×2): 2 g via INTRAVENOUS
  Filled 2016-09-29 (×2): qty 100

## 2016-09-29 MED ORDER — ONDANSETRON HCL 4 MG/2ML IJ SOLN
INTRAMUSCULAR | Status: DC | PRN
  Administered 2016-09-29: 4 mg via INTRAVENOUS

## 2016-09-29 MED ORDER — KETOROLAC TROMETHAMINE 30 MG/ML IJ SOLN
30.0000 mg | Freq: Four times a day (QID) | INTRAMUSCULAR | Status: AC
  Administered 2016-09-29 (×2): 30 mg via INTRAVENOUS
  Filled 2016-09-29 (×2): qty 1

## 2016-09-29 MED ORDER — SUGAMMADEX SODIUM 200 MG/2ML IV SOLN
INTRAVENOUS | Status: DC | PRN
  Administered 2016-09-29: 200 mg via INTRAVENOUS

## 2016-09-29 MED ORDER — BUPIVACAINE HCL (PF) 0.25 % IJ SOLN
INTRAMUSCULAR | Status: AC
  Filled 2016-09-29: qty 30

## 2016-09-29 MED ORDER — ONDANSETRON HCL 4 MG/2ML IJ SOLN
INTRAMUSCULAR | Status: AC
  Filled 2016-09-29: qty 2

## 2016-09-29 MED ORDER — PHENYLEPHRINE 40 MCG/ML (10ML) SYRINGE FOR IV PUSH (FOR BLOOD PRESSURE SUPPORT)
PREFILLED_SYRINGE | INTRAVENOUS | Status: DC | PRN
  Administered 2016-09-29: 80 ug via INTRAVENOUS
  Administered 2016-09-29 (×2): 40 ug via INTRAVENOUS
  Administered 2016-09-29: 80 ug via INTRAVENOUS
  Administered 2016-09-29: 40 ug via INTRAVENOUS

## 2016-09-29 MED ORDER — THROMBIN 20000 UNITS EX SOLR
CUTANEOUS | Status: DC | PRN
  Administered 2016-09-29: 09:00:00 via TOPICAL

## 2016-09-29 MED ORDER — EPHEDRINE 5 MG/ML INJ
INTRAVENOUS | Status: AC
  Filled 2016-09-29: qty 10

## 2016-09-29 MED ORDER — ONDANSETRON HCL 4 MG PO TABS
4.0000 mg | ORAL_TABLET | Freq: Four times a day (QID) | ORAL | Status: DC | PRN
Start: 2016-09-29 — End: 2016-09-30

## 2016-09-29 MED ORDER — SODIUM CHLORIDE 0.9% FLUSH: 3.0000 mL | INTRAVENOUS | Status: DC | PRN

## 2016-09-29 MED ORDER — CELECOXIB 200 MG PO CAPS
200.0000 mg | ORAL_CAPSULE | Freq: Two times a day (BID) | ORAL | Status: DC
  Administered 2016-09-29 – 2016-09-30 (×3): 200 mg via ORAL
  Filled 2016-09-29 (×3): qty 1

## 2016-09-29 MED ORDER — MENTHOL 3 MG MT LOZG: 1.0000 | LOZENGE | OROMUCOSAL | Status: DC | PRN

## 2016-09-29 MED ORDER — HYDROMORPHONE HCL 1 MG/ML IJ SOLN
0.2500 mg | INTRAMUSCULAR | Status: DC | PRN
  Administered 2016-09-29 (×3): 0.5 mg via INTRAVENOUS

## 2016-09-29 MED ORDER — DEXAMETHASONE SODIUM PHOSPHATE 10 MG/ML IJ SOLN
10.0000 mg | INTRAMUSCULAR | Status: AC
  Administered 2016-09-29: 10 mg via INTRAVENOUS
  Filled 2016-09-29: qty 1

## 2016-09-29 MED ORDER — LACTATED RINGERS IV SOLN
INTRAVENOUS | Status: DC | PRN
  Administered 2016-09-29 (×2): via INTRAVENOUS

## 2016-09-29 MED ORDER — THROMBIN 5000 UNITS EX SOLR
CUTANEOUS | Status: AC
  Filled 2016-09-29: qty 5000

## 2016-09-29 MED ORDER — SUCCINYLCHOLINE CHLORIDE 200 MG/10ML IV SOSY
PREFILLED_SYRINGE | INTRAVENOUS | Status: AC
  Filled 2016-09-29: qty 10

## 2016-09-29 MED FILL — Heparin Sodium (Porcine) Inj 1000 Unit/ML: INTRAMUSCULAR | Qty: 30 | Status: AC

## 2016-09-29 MED FILL — Sodium Chloride IV Soln 0.9%: INTRAVENOUS | Qty: 1000 | Status: AC

## 2016-09-29 SURGICAL SUPPLY — 112 items
ANCHOR LUMBAR 27 (Anchor) ×6 IMPLANT
ANCHOR LUMBAR 27MM (Anchor) ×3 IMPLANT
APPLIER CLIP 11 MED OPEN (CLIP) ×3
BAG DECANTER FOR FLEXI CONT (MISCELLANEOUS) ×3 IMPLANT
BENZOIN TINCTURE PRP APPL 2/3 (GAUZE/BANDAGES/DRESSINGS) IMPLANT
BONE MATRIX OSTEOCEL PRO MED (Bone Implant) ×3 IMPLANT
BUR BARREL STRAIGHT FLUTE 4.0 (BURR) ×3 IMPLANT
BUR EGG ELITE 5.0 (BURR) IMPLANT
BUR EGG ELITE 5.0MM (BURR)
BUR MATCHSTICK NEURO 3.0 LAGG (BURR) ×3 IMPLANT
CANISTER SUCT 3000ML PPV (MISCELLANEOUS) ×3 IMPLANT
CARTRIDGE OIL MAESTRO DRILL (MISCELLANEOUS) ×1 IMPLANT
CLIP APPLIE 11 MED OPEN (CLIP) ×1 IMPLANT
CLIP TI MEDIUM 24 (CLIP) IMPLANT
CLIP TI WIDE RED SMALL 24 (CLIP) IMPLANT
CLOSURE WOUND 1/2 X4 (GAUZE/BANDAGES/DRESSINGS)
COVER BACK TABLE 24X17X13 BIG (DRAPES) IMPLANT
COVER BACK TABLE 60X90IN (DRAPES) ×3 IMPLANT
DERMABOND ADVANCED (GAUZE/BANDAGES/DRESSINGS) ×4
DERMABOND ADVANCED .7 DNX12 (GAUZE/BANDAGES/DRESSINGS) ×2 IMPLANT
DIFFUSER DRILL AIR PNEUMATIC (MISCELLANEOUS) ×3 IMPLANT
DRAPE C-ARM 42X72 X-RAY (DRAPES) ×6 IMPLANT
DRAPE INCISE IOBAN 66X45 STRL (DRAPES) IMPLANT
DRAPE LAPAROTOMY 100X72X124 (DRAPES) ×3 IMPLANT
DRAPE POUCH INSTRU U-SHP 10X18 (DRAPES) ×3 IMPLANT
DURAPREP 26ML APPLICATOR (WOUND CARE) ×3 IMPLANT
ELECT BLADE 4.0 EZ CLEAN MEGAD (MISCELLANEOUS) ×3
ELECT REM PT RETURN 9FT ADLT (ELECTROSURGICAL) ×3
ELECTRODE BLDE 4.0 EZ CLN MEGD (MISCELLANEOUS) ×1 IMPLANT
ELECTRODE REM PT RTRN 9FT ADLT (ELECTROSURGICAL) ×1 IMPLANT
GAUZE SPONGE 4X4 12PLY STRL (GAUZE/BANDAGES/DRESSINGS) IMPLANT
GAUZE SPONGE 4X4 16PLY XRAY LF (GAUZE/BANDAGES/DRESSINGS) IMPLANT
GLOVE BIO SURGEON STRL SZ 6.5 (GLOVE) ×2 IMPLANT
GLOVE BIO SURGEON STRL SZ7 (GLOVE) IMPLANT
GLOVE BIO SURGEON STRL SZ8 (GLOVE) ×6 IMPLANT
GLOVE BIO SURGEON STRL SZ8.5 (GLOVE) IMPLANT
GLOVE BIO SURGEONS STRL SZ 6.5 (GLOVE) ×1
GLOVE BIOGEL PI IND STRL 7.0 (GLOVE) ×1 IMPLANT
GLOVE BIOGEL PI IND STRL 7.5 (GLOVE) ×1 IMPLANT
GLOVE BIOGEL PI INDICATOR 7.0 (GLOVE) ×2
GLOVE BIOGEL PI INDICATOR 7.5 (GLOVE) ×2
GLOVE ECLIPSE 6.5 STRL STRAW (GLOVE) IMPLANT
GLOVE ECLIPSE 7.0 STRL STRAW (GLOVE) IMPLANT
GLOVE ECLIPSE 7.5 STRL STRAW (GLOVE) IMPLANT
GLOVE INDICATOR 6.5 STRL GRN (GLOVE) IMPLANT
GLOVE INDICATOR 7.0 STRL GRN (GLOVE) IMPLANT
GLOVE INDICATOR 8.0 STRL GRN (GLOVE) IMPLANT
GLOVE OPTIFIT SS 6.5 STRL BRWN (GLOVE) IMPLANT
GLOVE OPTIFIT SS 7.0 STRL BRWN (GLOVE)
GLOVE OPTIFIT SS 7.5 STRL LX (GLOVE) IMPLANT
GLOVE OPTIFIT SS 8.0 STRL (GLOVE) IMPLANT
GLOVE OPTIFIT SS 8.5 STRL (GLOVE) IMPLANT
GLOVE SS N UNI LF 7.5 STRL (GLOVE) ×3 IMPLANT
GLOVE SURG SS PI 6.5 STRL IVOR (GLOVE) IMPLANT
GLOVE SURG SS PI 7.0 STRL IVOR (GLOVE) IMPLANT
GLOVE SURG SS PI 7.5 STRL IVOR (GLOVE) ×3 IMPLANT
GLOVE SURG SS PI 8.0 STRL IVOR (GLOVE) IMPLANT
GLOVE SURG SS PI 8.5 STRL IVOR (GLOVE)
GLOVE SURG SS PI 8.5 STRL STRW (GLOVE) IMPLANT
GOWN STRL REUS W/ TWL LRG LVL3 (GOWN DISPOSABLE) ×1 IMPLANT
GOWN STRL REUS W/ TWL XL LVL3 (GOWN DISPOSABLE) ×2 IMPLANT
GOWN STRL REUS W/TWL 2XL LVL3 (GOWN DISPOSABLE) ×6 IMPLANT
GOWN STRL REUS W/TWL LRG LVL3 (GOWN DISPOSABLE) ×2
GOWN STRL REUS W/TWL XL LVL3 (GOWN DISPOSABLE) ×4
HEMOSTAT POWDER KIT SURGIFOAM (HEMOSTASIS) ×3 IMPLANT
INSERT FOGARTY 61MM (MISCELLANEOUS) IMPLANT
INSERT FOGARTY SM (MISCELLANEOUS) IMPLANT
KIT BASIN OR (CUSTOM PROCEDURE TRAY) ×3 IMPLANT
KIT ROOM TURNOVER OR (KITS) ×3 IMPLANT
LOOP VESSEL MAXI BLUE (MISCELLANEOUS) IMPLANT
LOOP VESSEL MINI RED (MISCELLANEOUS) IMPLANT
NEEDLE SPNL 18GX3.5 QUINCKE PK (NEEDLE) ×3 IMPLANT
NS IRRIG 1000ML POUR BTL (IV SOLUTION) ×3 IMPLANT
OIL CARTRIDGE MAESTRO DRILL (MISCELLANEOUS) ×3
PACK LAMINECTOMY NEURO (CUSTOM PROCEDURE TRAY) ×3 IMPLANT
PAD ARMBOARD 7.5X6 YLW CONV (MISCELLANEOUS) ×9 IMPLANT
SPACER 29X39MM 8DEG 13MM INDE (Spacer) ×2 IMPLANT
SPONGE INTESTINAL PEANUT (DISPOSABLE) ×12 IMPLANT
SPONGE LAP 18X18 X RAY DECT (DISPOSABLE) ×3 IMPLANT
SPONGE LAP 4X18 X RAY DECT (DISPOSABLE) IMPLANT
SPONGE SURGIFOAM ABS GEL 100 (HEMOSTASIS) ×3 IMPLANT
STRIP BIOACTIVE 10CC 25X50X8 (Miscellaneous) ×3 IMPLANT
STRIP CLOSURE SKIN 1/2X4 (GAUZE/BANDAGES/DRESSINGS) IMPLANT
SUT MNCRL AB 4-0 PS2 18 (SUTURE) IMPLANT
SUT PDS AB 1 CTX 36 (SUTURE) ×6 IMPLANT
SUT PROLENE 4 0 RB 1 (SUTURE)
SUT PROLENE 4-0 RB1 .5 CRCL 36 (SUTURE) IMPLANT
SUT PROLENE 5 0 CC1 (SUTURE) ×6 IMPLANT
SUT PROLENE 6 0 C 1 30 (SUTURE) IMPLANT
SUT PROLENE 6 0 CC (SUTURE) IMPLANT
SUT SILK 0 TIES 10X30 (SUTURE) IMPLANT
SUT SILK 2 0 TIES 10X30 (SUTURE) ×3 IMPLANT
SUT SILK 2 0SH CR/8 30 (SUTURE) IMPLANT
SUT SILK 3 0 TIES 10X30 (SUTURE) IMPLANT
SUT SILK 3 0 TIES 17X18 (SUTURE)
SUT SILK 3 0SH CR/8 30 (SUTURE) IMPLANT
SUT SILK 3-0 18XBRD TIE BLK (SUTURE) IMPLANT
SUT VIC AB 0 CT1 18XCR BRD8 (SUTURE) IMPLANT
SUT VIC AB 0 CT1 27 (SUTURE)
SUT VIC AB 0 CT1 27XBRD ANBCTR (SUTURE) IMPLANT
SUT VIC AB 0 CT1 8-18 (SUTURE)
SUT VIC AB 2-0 CP2 18 (SUTURE) ×3 IMPLANT
SUT VIC AB 2-0 CT1 27 (SUTURE)
SUT VIC AB 2-0 CT1 TAPERPNT 27 (SUTURE) IMPLANT
SUT VIC AB 3-0 SH 27 (SUTURE)
SUT VIC AB 3-0 SH 27X BRD (SUTURE) IMPLANT
SUT VIC AB 3-0 SH 8-18 (SUTURE) ×6 IMPLANT
SUT VICRYL 4-0 PS2 18IN ABS (SUTURE) IMPLANT
TOWEL GREEN STERILE (TOWEL DISPOSABLE) ×2 IMPLANT
TOWEL GREEN STERILE FF (TOWEL DISPOSABLE) ×3 IMPLANT
TRAY FOLEY W/METER SILVER 16FR (SET/KITS/TRAYS/PACK) ×3 IMPLANT
WATER STERILE IRR 1000ML POUR (IV SOLUTION) ×3 IMPLANT

## 2016-09-29 NOTE — Op Note (Signed)
    Patient name: Kyle Hoover MRN: [id number] DOB: 05-02-1986 Sex: male  09/29/2016 Pre-operative Diagnosis: Degenerative Back disease Post-operative diagnosis:  Same Surgeon:  Durene CalBrabham, Wells Assistants:  Karsten RoKim Trinh Co-surgeon:  Marikay Alaravid Jones Procedure:   anterior exposure for L5-S1 Anesthesia:  Gereral Blood Loss:  See anesthesia record Specimens:  none  Findings:  Anatomy  Indications:  The patient was seen and evaluated by Dr. Yetta BarreJones with neurosurgery who felt that anterior approach to L5-S1 instrumentation was warranted.  I have been asked to provide anterior exposure.  The risks and benefits of the procedure were discussed with the patient preoperatively.  All questions were answered.  Procedure:  The patient was identified in the holding area and [facility]  The patient was then placed supine on the table. general anesthesia was administered.  The patient was prepped and draped in the usual sterile fashion.  A time out was called and antibiotics were administered.  Fluoroscopy was used to identify the I entered the retroperitoneal space from lateral to the rectus muscle.  I identified the external iliac artery and vein and bluntly mobilized them.  The abdominal contents were mobilized superiorly and medially.  I identified the ureter and mobilized this and reflected it laterally.  I then identified the L5-S1 disc space and cleaned this off, ligating the median sacral vessels with both cautery and metal clips.  The Thompson retractor was then inserted.  I used 150 reversed lip blades on either side of the spine and 190 malleable retractors superiorly and inferiorly.  A 5-0 suture was placed on the iliac vein where a branch was torn.  At this point, fluoroscopy return to confirm that we were at the appropriate level.  Dr. Yetta BarreJones took over the case at this point in time.  Please see his operative note for the remaining portion of the procedure.      Juleen ChinaV. Wells Sherle Mello,  M.D. [facility] [place] Office: [phone] Pager:  [phone]

## 2016-09-29 NOTE — Op Note (Signed)
09/29/2016  10:21 AM  PATIENT:  Kyle Hoover  31 y.o. male  PRE-OPERATIVE DIAGNOSIS:  Recurrent lumbar disc herniation L5-S1 with back and leg pain  POST-OPERATIVE DIAGNOSIS:  same  PROCEDURE:  Anterior lumbar interbody fusion L5-S1 utilizing a globus peek interbody cage packed with morcellized allograft, followed by fixation with 3 separate vertebral body pins placed through the device  SURGEON:  Marikay Alaravid Dianne Bady, MD  Co-surgeon: Dr. Myra GianottiBrabham  ANESTHESIA:   General  EBL: Less than 100 ml  Total I/O In: 1500 [I.V.:1500] Out: 800 [Urine:800]  BLOOD ADMINISTERED: none  DRAINS: None  SPECIMEN:  none  INDICATION FOR PROCEDURE: This patient presented with severe back and leg pain. He had a previous discectomy at L5-S1 by another surgeon.. Imaging showed a large recurrent disc herniation.. The patient tried conservative measures without relief. Pain was debilitating. Recommended anterior lumbar interbody fusion L5-S1. Patient understood the risks, benefits, and alternatives and potential outcomes and wished to proceed.  PROCEDURE DETAILS: The patient was taken to the operating room and after induction of adequate generalized endotracheal anesthesia he was placed in the supine position on the operating room table. The exposure was performed by Dr. Myra GianottiBrabham and will be described in a separate operative report. I marked the midline of the L5-S1 disc with a needle and AP fluoroscopy. I then marked the vertebral body above and below. I then used lateral fluoroscopy to confirm the L5-S1 level. I then incised the disc space with a 15 blade scalpel. I then released the disc from the endplates with a Cobb curet utilizing lateral fluoroscopy. I then removed the disc en bloc. The endplates with curettes and the high-speed drill. I distracted the disc space to 9 mm an 11 mm with distractors. I decompressed the epidural space. I removed the disc fragment from the right space. I then used sequential trials  to determine the size of the implant. We chose a 8 15 mm cage. This was packed with morcellized allograft and tapped into position at L5-S1. We then placed 3 pins into the L5 and S1 bodies by tapping them through the cage utilizing a guide. We placed and S1 and 1 into L5. We then checked our construct with AP and lateral fluoroscopy. I then irrigated. I then removed the retractor and checked for any bleeding. There was none. A final AP fluoroscopy shot followed by a final AP abdominal x-ray. I was able to close the fascia with a running 2-0 Prolene. I then closed the subcutaneous tissue 2-0 Vicryl. A closed the subcuticular tissue with 0 Vicryl. The skin was closed with Dermabond. The drapes were removed. We waited the report of the final x-ray. She was then awakened from general anesthesia and transferred in stable condition. At the end of the procedure all sponge needle and instrument counts are correct.   PLAN OF CARE: Admit for overnight observation  PATIENT DISPOSITION:  PACU - hemodynamically stable.   Delay start of Pharmacological VTE agent (>24hrs) due to surgical blood loss or risk of bleeding:  yes

## 2016-09-29 NOTE — Evaluation (Signed)
Physical Therapy Evaluation Patient Details Name: Kyle CanaryBrandon Richman MRN: [id number] DOB: 1985-08-23 Today's Date: 09/29/2016   History of Present Illness  Pt is a 31 y.o. male s/p Anterior Lumbar Interbody Fusion Lumbar five-Sacral one. PMHx: Back sx x2, GERD.   Clinical Impression  Patient is s/p above surgery resulting in the deficits listed below (see PT Problem List). PTA, pt was independent with functional mobility and working as a Corporate investment bankerconstruction worker. Upon evaluation, pt limited by pain in BLE and slightly decreased steadiness. Pt requiring from supervision to min guard for functional mobility tasks. Equipment recommendations below. Patient will benefit from skilled PT to increase their independence and safety with mobility (while adhering to their precautions) to allow discharge to the venue listed below.     Follow Up Recommendations No PT follow up;Supervision - Intermittent    Equipment Recommendations  3in1 (PT)    Recommendations for Other Services       Precautions / Restrictions Precautions Precautions: Back Precaution Booklet Issued: Yes (comment) Precaution Comments: Pt able to recall 2/3 back precautions. Verbal cues for no lifting precaution Required Braces or Orthoses:  (No order but biotech delivered brace ) Restrictions Weight Bearing Restrictions: No      Mobility  Bed Mobility Overal bed mobility: Needs Assistance Bed Mobility: Rolling;Sidelying to Sit Rolling: Supervision Sidelying to sit: Supervision       General bed mobility comments: In chair upon entry   Transfers Overall transfer level: Needs assistance Equipment used: None Transfers: Sit to/from Stand Sit to Stand: Supervision         General transfer comment: Supervision for safety. Extra time required secondary to pain.   Ambulation/Gait Ambulation/Gait assistance: Supervision Ambulation Distance (Feet): 300 Feet Assistive device: None Gait Pattern/deviations: Step-through  pattern;Decreased stride length;Antalgic Gait velocity: Decreased Gait velocity interpretation: Below normal speed for age/gender General Gait Details: Slow, antalgic gait, but overall steady gait. No LOB noted.   Stairs Stairs: Yes Stairs assistance: Min guard Stair Management: One rail Left;Step to pattern;Forwards (HHA on R ) Number of Stairs: 10 General stair comments: Verbal cues for step to technique to increase safety. Min guard and HHA for steadying. Will need to practice without rail and with wife assisting as pt does not have rail at home.   Wheelchair Mobility    Modified Rankin (Stroke Patients Only)       Balance Overall balance assessment: Needs assistance Sitting-balance support: No upper extremity supported;Feet supported Sitting balance-Leahy Scale: Good     Standing balance support: No upper extremity supported;During functional activity Standing balance-Leahy Scale: Good                               Pertinent Vitals/Pain Pain Assessment: 0-10 Pain Score: 6  Faces Pain Scale: Hurts little more Pain Location: back, hips/buttocks (LLE>RLE) Pain Descriptors / Indicators: Sore;Constant Pain Intervention(s): Limited activity within patient's tolerance;Monitored during session;Repositioned    Home Living Family/patient expects to be discharged to:: Private residence Living Arrangements: Spouse/significant other Available Help at Discharge: Family;Available 24 hours/day Type of Home: House Home Access: Stairs to enter Entrance Stairs-Rails: None Entrance Stairs-Number of Steps: 2 Home Layout: One level Home Equipment: None      Prior Function Level of Independence: Independent               Hand Dominance        Extremity/Trunk Assessment   Upper Extremity Assessment Upper Extremity Assessment: Defer to OT evaluation  Lower Extremity Assessment Lower Extremity Assessment: RLE deficits/detail;LLE deficits/detail RLE  Deficits / Details: Pain at baseline into buttock and down leg.  LLE Deficits / Details: Pain at baseline into hip/buttocks; worse than RLE.     Cervical / Trunk Assessment Cervical / Trunk Assessment: Other exceptions Cervical / Trunk Exceptions: s/p spinal sx  Communication   Communication: No difficulties  Cognition Arousal/Alertness: Awake/alert Behavior During Therapy: WFL for tasks assessed/performed Overall Cognitive Status: Within Functional Limits for tasks assessed                                        General Comments General comments (skin integrity, edema, etc.): Reviewed generalized walking program to perform at home. Pt asking about RW or cane to use outside. Educated about areas to avoid and pt with overall steady balance, so no need for device. Reviewed car transfer technique. Reviewed need for 3 in 1 to sit while showering.     Exercises     Assessment/Plan    PT Assessment Patient needs continued PT services  PT Problem List Decreased balance;Decreased mobility;Decreased knowledge of precautions;Pain       PT Treatment Interventions DME instruction;Gait training;Stair training;Functional mobility training;Therapeutic activities;Therapeutic exercise;Balance training;Neuromuscular re-education;Patient/family education    PT Goals (Current goals can be found in the Care Plan section)  Acute Rehab PT Goals Patient Stated Goal: return home PT Goal Formulation: With patient Time For Goal Achievement: 10/06/16 Potential to Achieve Goals: Good    Frequency Min 5X/week   Barriers to discharge        Co-evaluation               AM-PAC PT "6 Clicks" Daily Activity  Outcome Measure Difficulty turning over in bed (including adjusting bedclothes, sheets and blankets)?: A Little Difficulty moving from lying on back to sitting on the side of the bed? : A Little Difficulty sitting down on and standing up from a chair with arms (e.g.,  wheelchair, bedside commode, etc,.)?: A Little Help needed moving to and from a bed to chair (including a wheelchair)?: A Little Help needed walking in hospital room?: A Little Help needed climbing 3-5 steps with a railing? : A Little 6 Click Score: 18    End of Session Equipment Utilized During Treatment: Gait belt Activity Tolerance: Patient tolerated treatment well Patient left: in chair;with call bell/phone within reach Nurse Communication: Mobility status PT Visit Diagnosis: Other abnormalities of gait and mobility (R26.89);Pain Pain - Right/Left: Left (adn right ) Pain - part of body: Leg    Time: 1610-9604 PT Time Calculation (min) (ACUTE ONLY): 15 min   Charges:   PT Evaluation $PT Eval Low Complexity: 1 Procedure PT Treatments $Gait Training: 8-22 mins   PT G Codes:        Margot Chimes, PT, DPT  Acute Rehabilitation Services  Pager: [phone]   Melvyn Novas 09/29/2016, 4:41 PM

## 2016-09-29 NOTE — Anesthesia Procedure Notes (Signed)
Procedure Name: Intubation Date/Time: 09/29/2016 7:48 AM Performed by: Carney Living Pre-anesthesia Checklist: Patient identified, Emergency Drugs available, Suction available, Patient being monitored and Timeout performed Patient Re-evaluated:Patient Re-evaluated prior to inductionOxygen Delivery Method: Circle system utilized Preoxygenation: Pre-oxygenation with 100% oxygen Intubation Type: IV induction Ventilation: Mask ventilation without difficulty Laryngoscope Size: Mac and 4 Grade View: Grade I Tube type: Oral Tube size: 7.5 mm Number of attempts: 1 Airway Equipment and Method: Stylet Placement Confirmation: ETT inserted through vocal cords under direct vision,  positive ETCO2 and breath sounds checked- equal and bilateral Secured at: 22 cm Tube secured with: Tape Dental Injury: Teeth and Oropharynx as per pre-operative assessment

## 2016-09-29 NOTE — H&P (Signed)
Subjective: Patient is a 31 y.o. male admitted for ALIF for recurrent HNP L5-S1. Onset of symptoms was several months ago, gradually worsening since that time.  The pain is rated severe, and is located at the across the lower back and radiates to legs. The pain is described as aching and occurs all day. The symptoms have been progressive. Symptoms are exacerbated by exercise. MRI or CT showed recurrent HNP   Past Medical History:  Diagnosis Date  . Chronic back pain   . Family history of adverse reaction to anesthesia    dad gets sick  . GERD (gastroesophageal reflux disease)    takes Omeprazole daily  . PONV (postoperative nausea and vomiting)     Past Surgical History:  Procedure Laterality Date  . BACK SURGERY     x 2  . HERNIA REPAIR     as a baby    Prior to Admission medications   Medication Sig Start Date End Date Taking? Authorizing Provider  cetirizine (ZYRTEC) 10 MG tablet Take 10 mg by mouth daily as needed for allergies.    Yes [provider]  HYDROcodone-acetaminophen (NORCO/VICODIN) 5-325 MG tablet Take 1 tablet by mouth at bedtime as needed for moderate pain.   Yes [provider]  omeprazole (PRILOSEC) 20 MG capsule Take 40 mg by mouth daily.    Yes [provider]   Allergies  Allergen Reactions  . No Known Allergies     Social History  Substance Use Topics  . Smoking status: Former Games developermoker  . Smokeless tobacco: Current User    Types: Snuff     Comment: quit smoking 11 yrs ago  . Alcohol use No    Family History  Problem Relation Age of Onset  . Stroke Mother   . Heart failure Mother   . Asthma Mother   . Asthma Father   . Heart failure Father      Review of Systems  Positive ROS: neg  All other systems have been reviewed and were otherwise negative with the exception of those mentioned in the HPI and as above.  Objective: Vital signs in last 24 hours: Temp:  [97.9 F (36.6 C)] 97.9 F (36.6 C) (05/11 0602) Pulse  Rate:  [90] 90 (05/11 0602) Resp:  [20] 20 (05/11 0602) BP: (150)/(98) 150/98 (05/11 0602) SpO2:  [100 %] 100 % (05/11 0602)  General Appearance: Alert, cooperative, no distress, appears stated age Head: Normocephalic, without obvious abnormality, atraumatic Eyes: PERRL, conjunctiva/corneas clear, EOM's intact    Neck: Supple, symmetrical, trachea midline Back: Symmetric, no curvature, ROM normal, no CVA tenderness Lungs:  respirations unlabored Heart: Regular rate and rhythm Abdomen: Soft, non-tender Extremities: Extremities normal, atraumatic, no cyanosis or edema Pulses: 2+ and symmetric all extremities Skin: Skin color, texture, turgor normal, no rashes or lesions  NEUROLOGIC:   Mental status: Alert and oriented x4,  no aphasia, good attention span, fund of knowledge, and memory Motor Exam - grossly normal Sensory Exam - grossly normal Reflexes: 1= Coordination - grossly normal Gait - grossly normal Balance - grossly normal Cranial Nerves: I: smell Not tested  II: visual acuity  OS: nl    OD: nl  II: visual fields Full to confrontation  II: pupils Equal, round, reactive to light  III,VII: ptosis None  III,IV,VI: extraocular muscles  Full ROM  V: mastication Normal  V: facial light touch sensation  Normal  V,VII: corneal reflex  Present  VII: facial muscle function - upper  Normal  VII:  facial muscle function - lower Normal  VIII: hearing Not tested  IX: soft palate elevation  Normal  IX,X: gag reflex Present  XI: trapezius strength  5/5  XI: sternocleidomastoid strength 5/5  XI: neck flexion strength  5/5  XII: tongue strength  Normal    Data Review Lab Results  Component Value Date   WBC 6.0 09/19/2016   HGB 15.9 09/19/2016   HCT 45.9 09/19/2016   MCV 88.4 09/19/2016   PLT 283 09/19/2016   Lab Results  Component Value Date   NA 138 09/19/2016   K 4.4 09/19/2016   CL 108 09/19/2016   CO2 23 09/19/2016   BUN 13 09/19/2016   CREATININE 0.81 09/19/2016    GLUCOSE 95 09/19/2016   Lab Results  Component Value Date   INR 1.00 09/19/2016    Assessment/Plan: Patient admitted for ALIF L5-s1. Patient has failed a reasonable attempt at conservative therapy.  I explained the condition and procedure to the patient and answered any questions.  Patient wishes to proceed with procedure as planned. Understands risks/ benefits and typical outcomes of procedure.   Damonie Furney S 09/29/2016 7:34 AM

## 2016-09-29 NOTE — H&P (View-Only) (Signed)
[facility]  Patient name: Kyle Hoover MRN: [id number] DOB: [birth year] Sex: male   REFERRING PROVIDER:    Dr. Yetta Barre   REASON FOR CONSULT:    Back pain  HISTORY OF PRESENT ILLNESS:   Kyle Hoover is a 31 y.o. male, who is Referred today for discussions regarding anterior exposure for back instrumentation at the L5-S1 disc space.  The patient has previous undergone microdiscectomy.  Since October of last year he has had a return of bilateral radicular pain.  Instrumentation at the L5-S1 disc space has been recommended.  The patient denies having had any abdominal surgery.  He does take allergy medication and medication for reflux.  He has a history of tobacco abuse.  PAST MEDICAL HISTORY    Past Medical History:  Diagnosis Date  . Chronic back pain   . Family history of adverse reaction to anesthesia    dad gets sick  . GERD (gastroesophageal reflux disease)    takes Omeprazole daily     FAMILY HISTORY   Family History  Problem Relation Age of Onset  . Stroke Mother   . Heart failure Mother   . Asthma Mother   . Asthma Father   . Heart failure Father     SOCIAL HISTORY:   Social History   Social History  . Marital status: Married    Spouse name: N/A  . Number of children: N/A  . Years of education: N/A   Occupational History  . Not on file.   Social History Main Topics  . Smoking status: Former Games developer  . Smokeless tobacco: Current User    Types: Snuff     Comment: quit smoking 11 yrs ago  . Alcohol use No  . Drug use: No  . Sexual activity: Not on file   Other Topics Concern  . Not on file   Social History Narrative  . No narrative on file    ALLERGIES:    No Known Allergies  CURRENT MEDICATIONS:    Current Outpatient Prescriptions  Medication Sig Dispense Refill  . cetirizine (ZYRTEC) 10 MG tablet Take 10 mg by mouth daily as needed for allergies.     Marland Kitchen  HYDROcodone-acetaminophen (NORCO/VICODIN) 5-325 MG tablet Take 1 tablet by mouth at bedtime as needed for moderate pain.    Marland Kitchen omeprazole (PRILOSEC) 20 MG capsule Take 40 mg by mouth daily.      No current facility-administered medications for this visit.     REVIEW OF SYSTEMS:    denotes positive finding,  denotes negative finding Cardiac  Comments:  Chest pain or chest pressure:    Shortness of breath upon exertion:    Short of breath when lying flat:    Irregular heart rhythm:        Vascular    Pain in calf, thigh, or hip brought on by ambulation:    Pain in feet at night that wakes you up from your sleep:     Blood clot in your veins:    Leg swelling:         Pulmonary    Oxygen at home:    Productive cough:     Wheezing:         Neurologic    Sudden weakness in arms or legs:     Sudden numbness in arms or legs:     Sudden onset of difficulty speaking or slurred speech:    Temporary loss of vision in one eye:  Problems with dizziness:         Gastrointestinal    Blood in stool:      Vomited blood:         Genitourinary    Burning when urinating:     Blood in urine:        Psychiatric    Major depression:         Hematologic    Bleeding problems:    Problems with blood clotting too easily:        Skin    Rashes or ulcers:        Constitutional    Fever or chills:     PHYSICAL EXAM:   Vitals:   09/20/16 1440  BP: 112/77  Pulse: 82  Resp: 20  Temp: 97.5 F (36.4 C)  TempSrc: Oral  SpO2: 98%  Weight: 207 lb 8 oz (94.1 kg)  Height: 5\' 11"  (1.803 m)    GENERAL: The patient is a well-nourished male, in no acute distress. The vital signs are documented above. CARDIAC: There is a regular rate and rhythm.  VASCULAR: Palpable pedal pulses bilaterally.  No significant edema PULMONARY: Nonlabored respirations MUSCULOSKELETAL: There are no major deformities or cyanosis. NEUROLOGIC: No focal weakness or paresthesias are detected. SKIN: There  are no ulcers or rashes noted. PSYCHIATRIC: The patient has a normal affect.  STUDIES:   none  ASSESSMENT and PLAN   Degenerative back pain: The patient is scheduled for anterior instrumentation of the L5-S1 disc space.  I have been asked to provide anterior exposure.  I discussed the details of the procedure with the patient including the incision, as well as the risk of injury to the iliac artery, iliac vein, and ureter.  We also discussed the risk of retrograde ejaculation.  The patient understands these and wishes to proceed.   Durene CalWells Jiali Linney, MD [facility] Tel [phone] Pager [phone]

## 2016-09-29 NOTE — Evaluation (Signed)
Occupational Therapy Evaluation and Discharge Patient Details Name: Kyle CanaryBrandon Hoover MRN: [id number] DOB: Apr 22, 1986 Today's Date: 09/29/2016    History of Present Illness Pt is a 31 y.o. male s/p Anterior Lumbar Interbody Fusion Lumbar five-Sacral one. PMHx: Back sx x2, GERD.    Clinical Impression   Pt reports he was independent with ADL and mobility PTA. Currently pt overall supervision for ADL and functional mobility; quickly approaching mod I. All back, safety, and ADL education completed. Pt planning to d/c home with 24/7 supervision initially from family. No further acute OT needs identified; signing off at this time. Please re-consult if needs change. Thank you for this referral.    Follow Up Recommendations  No OT follow up;Supervision - Intermittent    Equipment Recommendations  None recommended by OT    Recommendations for Other Services       Precautions / Restrictions Precautions Precautions: Back Precaution Booklet Issued: Yes (comment) Precaution Comments: Educated pt on 3/3 back preauctions. Required Braces or Orthoses:  (No order but biotech delivered brace at end of session) Restrictions Weight Bearing Restrictions: No      Mobility Bed Mobility Overal bed mobility: Needs Assistance Bed Mobility: Rolling;Sidelying to Sit Rolling: Supervision Sidelying to sit: Supervision       General bed mobility comments: HOB flat with use of bed rail. Verbal cues for technique.  Transfers Overall transfer level: Needs assistance Equipment used: None Transfers: Sit to/from Stand Sit to Stand: Supervision         General transfer comment: for safety, no physical assist    Balance Overall balance assessment: No apparent balance deficits (not formally assessed)                                         ADL either performed or assessed with clinical judgement   ADL Overall ADL's : Needs assistance/impaired Eating/Feeding:  Independent;Sitting   Grooming: Supervision/safety;Standing Grooming Details (indicate cue type and reason): Educated on use of 2 cups for oral care Upper Body Bathing: Supervision/ safety;Sitting   Lower Body Bathing: Supervison/ safety;Sit to/from stand   Upper Body Dressing : Supervision/safety;Sitting   Lower Body Dressing: Supervision/safety;Sit to/from stand Lower Body Dressing Details (indicate cue type and reason): Pt able to cross foot over opposite knee. Educated on compensatory strategies for LB ADL. Toilet Transfer: Supervision/safety;Ambulation;Regular Toilet   Toileting- ArchitectClothing Manipulation and Hygiene: Supervision/safety;Sit to/from stand Toileting - Clothing Manipulation Details (indicate cue type and reason): Educated on proper technique for peri care and use of wet wipes Tub/ Shower Transfer: Supervision/safety;Tub transfer;Ambulation Tub/Shower Transfer Details (indicate cue type and reason): Simulated. Recommend supervision for first time in shower Functional mobility during ADLs: Supervision/safety General ADL Comments: Educated pt on maintaining back precuations during functional activities, keeping frequently used items at counter top height, log roll technique for bed mobility, frequent mobililty throughout the day upon return home.     Vision         Perception     Praxis      Pertinent Vitals/Pain Pain Assessment: Faces Faces Pain Scale: Hurts little more Pain Location: back, hips, buttocks Pain Descriptors / Indicators: Sore Pain Intervention(s): Monitored during session;Repositioned     Hand Dominance     Extremity/Trunk Assessment Upper Extremity Assessment Upper Extremity Assessment: Overall WFL for tasks assessed   Lower Extremity Assessment Lower Extremity Assessment: Defer to PT evaluation   Cervical / Trunk Assessment Cervical /  Trunk Assessment: Other exceptions Cervical / Trunk Exceptions: s/p spinal sx   Communication  Communication Communication: No difficulties   Cognition Arousal/Alertness: Awake/alert Behavior During Therapy: WFL for tasks assessed/performed Overall Cognitive Status: Within Functional Limits for tasks assessed                                     General Comments       Exercises     Shoulder Instructions      Home Living Family/patient expects to be discharged to:: Private residence Living Arrangements: Spouse/significant other Available Help at Discharge: Family;Available 24 hours/day Type of Home: House Home Access: Stairs to enter [employer] of Steps: 2   Home Layout: One level     Bathroom Shower/Tub: IT trainer: Standard     Home Equipment: None          Prior Functioning/Environment Level of Independence: Independent                 OT Problem List:        OT Treatment/Interventions:      OT Goals(Current goals can be found in the care plan section) Acute Rehab OT Goals Patient Stated Goal: return home OT Goal Formulation: All assessment and education complete, DC therapy  OT Frequency:     Barriers to D/C:            Co-evaluation              AM-PAC PT "6 Clicks" Daily Activity     Outcome Measure Help from another person eating meals?: None Help from another person taking care of personal grooming?: A Little Help from another person toileting, which includes using toliet, bedpan, or urinal?: A Little Help from another person bathing (including washing, rinsing, drying)?: A Little Help from another person to put on and taking off regular upper body clothing?: A Little Help from another person to put on and taking off regular lower body clothing?: A Little 6 Click Score: 19   End of Session Nurse Communication: Mobility status;Other (comment) (no f/u or equipment needs)  Activity Tolerance: Patient tolerated treatment well Patient left: in chair;with call bell/phone  within reach  OT Visit Diagnosis: Unsteadiness on feet (R26.81)                Time: 1610-9604 OT Time Calculation (min): 15 min Charges:  OT General Charges $OT Visit: 1 Procedure OT Evaluation $OT Eval Low Complexity: 1 Procedure G-Codes:     Letonya Mangels A. Brett Albino, M.S., OTR/L Pager: [phone]  Gaye Alken 09/29/2016, 3:59 PM

## 2016-09-29 NOTE — Anesthesia Postprocedure Evaluation (Signed)
Anesthesia Post Note  Patient: Kyle CanaryBrandon Hoover  Procedure(s) Performed: Procedure(s) (LRB): Anterior Lumbar Interbody Fusion Lumbar five-Sacral one (N/A) ABDOMINAL EXPOSURE (N/A)  Patient location during evaluation: PACU Anesthesia Type: General Level of consciousness: awake and alert Pain management: pain level controlled Vital Signs Assessment: post-procedure vital signs reviewed and stable Respiratory status: spontaneous breathing, nonlabored ventilation, respiratory function stable and patient connected to nasal cannula oxygen Cardiovascular status: blood pressure returned to baseline and stable Postop Assessment: no signs of nausea or vomiting Anesthetic complications: no       Last Vitals:  Vitals:   09/29/16 1121 09/29/16 1225  BP: (!) 142/84 121/73  Pulse: (!) 124 (!) 118  Resp: 11 20  Temp: 36.6 C 36.9 C    Last Pain:  Vitals:   09/29/16 1225  TempSrc: Oral  PainSc: 3                  Cniyah Sproull DAVID

## 2016-09-29 NOTE — Transfer of Care (Signed)
Immediate Anesthesia Transfer of Care Note  Patient: Kyle Hoover  Procedure(s) Performed: Procedure(s): Anterior Lumbar Interbody Fusion Lumbar five-Sacral one (N/A) ABDOMINAL EXPOSURE (N/A)  Patient Location: PACU  Anesthesia Type:General  Level of Consciousness: awake, alert , oriented and patient cooperative  Airway & Oxygen Therapy: Patient Spontanous Breathing and Patient connected to nasal cannula oxygen  Post-op Assessment: Report given to RN, Post -op Vital signs reviewed and stable and Patient moving all extremities X 4  Post vital signs: Reviewed and stable  Last Vitals:  Vitals:   09/29/16 0602  BP: (!) 150/98  Pulse: 90  Resp: 20  Temp: 36.6 C    Last Pain:  Vitals:   09/29/16 0602  TempSrc: Oral         Complications: No apparent anesthesia complications

## 2016-09-29 NOTE — Anesthesia Preprocedure Evaluation (Addendum)
Anesthesia Evaluation  Patient identified by MRN, date of birth, ID band Patient awake    Reviewed: Allergy & Precautions, NPO status , Patient's Chart, lab work & pertinent test results  History of Anesthesia Complications (+) PONV  Airway Mallampati: I  TM Distance: >3 FB Neck ROM: Full    Dental  (+) Teeth Intact, Dental Advisory Given   Pulmonary sleep apnea , former smoker,    Pulmonary exam normal        Cardiovascular Normal cardiovascular exam     Neuro/Psych    GI/Hepatic GERD  Controlled and Medicated,  Endo/Other    Renal/GU      Musculoskeletal   Abdominal   Peds  Hematology   Anesthesia Other Findings   Reproductive/Obstetrics                            Anesthesia Physical Anesthesia Plan  ASA: II  Anesthesia Plan: General   Post-op Pain Management:    Induction: Intravenous  Airway Management Planned: Oral ETT  Additional Equipment:   Intra-op Plan:   Post-operative Plan: Extubation in OR  Informed Consent: I have reviewed the patients History and Physical, chart, labs and discussed the procedure including the risks, benefits and alternatives for the proposed anesthesia with the patient or authorized representative who has indicated his/her understanding and acceptance.   Dental advisory given  Plan Discussed with: CRNA, Surgeon and Anesthesiologist  Anesthesia Plan Comments:        Anesthesia Quick Evaluation

## 2016-09-29 NOTE — Interval H&P Note (Signed)
History and Physical Interval Note:  09/29/2016 6:38 AM  Kyle Hoover  has presented today for surgery, with the diagnosis of HNP  The various methods of treatment have been discussed with the patient and family. After consideration of risks, benefits and other options for treatment, the patient has consented to  Procedure(s): ALIF - L5-S1 (N/A) ABDOMINAL EXPOSURE (N/A) as a surgical intervention .  The patient's history has been reviewed, patient examined, no change in status, stable for surgery.  I have reviewed the patient's chart and labs.  Questions were answered to the patient's satisfaction.     Durene CalBrabham, Wells

## 2016-09-30 MED ORDER — METHOCARBAMOL 750 MG PO TABS: 750.0000 mg | ORAL_TABLET | Freq: Four times a day (QID) | ORAL | 2 refills | Status: DC | PRN

## 2016-09-30 MED ORDER — OXYCODONE-ACETAMINOPHEN 5-325 MG PO TABS: 1.0000 | ORAL_TABLET | ORAL | 0 refills | Status: DC | PRN

## 2016-09-30 NOTE — Progress Notes (Signed)
Physical Therapy Treatment Patient Details Name: Kyle Hoover MRN: [id number] DOB: [birth year] Today's Date: 09/30/2016    History of Present Illness Pt is a 31 y.o. male s/p Anterior Lumbar Interbody Fusion Lumbar five-Sacral one. PMHx: Back sx x2, GERD.     PT Comments    Patient progressing well towards PT goals. Has been walking hallways all morning due to pain shooting down BLEs. Explained healing progression and swelling from surgery. Able to recall 3/3 back precautions and aware that he does not always follow these during mobility. Tolerated stair training without rails today, with Min guard assist for safety due to pain. Discussed body mechanics and how to pick object up off floor safely. Pt plans to d/c home today. Will follow if still in hospital.    Follow Up Recommendations  No PT follow up;Supervision - Intermittent     Equipment Recommendations  3in1 (PT)    Recommendations for Other Services       Precautions / Restrictions Precautions Precautions: Back Precaution Booklet Issued: Yes (comment) Precaution Comments: Able to recall 3/3 back precautions. Restrictions Weight Bearing Restrictions: No    Mobility  Bed Mobility               General bed mobility comments: Up walking hall upon arrival.  Transfers                    Ambulation/Gait Ambulation/Gait assistance: Modified independent (Device/Increase time) Ambulation Distance (Feet): 400 Feet Assistive device: None Gait Pattern/deviations: Step-through pattern;Decreased stride length;Antalgic Gait velocity: Decreased Gait velocity interpretation: Below normal speed for age/gender General Gait Details: Slow, antalgic gait, but overall steady gait. No LOB noted.    Stairs Stairs: Yes   Stair Management: No rails;Alternating pattern;Step to pattern Number of Stairs: 13 General stair comments: Able to perform alternating step technique to ascend steps and step to technique to  descend steps. Increased time.  Wheelchair Mobility    Modified Rankin (Stroke Patients Only)       Balance Overall balance assessment: Needs assistance Sitting-balance support: Feet supported;No upper extremity supported Sitting balance-Leahy Scale: Normal     Standing balance support: During functional activity Standing balance-Leahy Scale: Good                              Cognition Arousal/Alertness: Awake/alert Behavior During Therapy: WFL for tasks assessed/performed Overall Cognitive Status: Within Functional Limits for tasks assessed                                        Exercises      General Comments        Pertinent Vitals/Pain Pain Assessment: Faces Faces Pain Scale: Hurts even more Pain Location: back, BLEs. Pain Descriptors / Indicators: Sore;Constant;Radiating Pain Intervention(s): Monitored during session;Repositioned    Home Living                      Prior Function            PT Goals (current goals can now be found in the care plan section) Progress towards PT goals: Progressing toward goals    Frequency    Min 5X/week      PT Plan Current plan remains appropriate    Co-evaluation              AM-PAC PT "  6 Clicks" Daily Activity  Outcome Measure  Difficulty turning over in bed (including adjusting bedclothes, sheets and blankets)?: None Difficulty moving from lying on back to sitting on the side of the bed? : None Difficulty sitting down on and standing up from a chair with arms (e.g., wheelchair, bedside commode, etc,.)?: None Help needed moving to and from a bed to chair (including a wheelchair)?: None Help needed walking in hospital room?: None Help needed climbing 3-5 steps with a railing? : None 6 Click Score: 24    End of Session   Activity Tolerance: Patient tolerated treatment well Patient left: in chair;with call bell/phone within reach Nurse Communication: Mobility  status PT Visit Diagnosis: Other abnormalities of gait and mobility (R26.89);Pain Pain - Right/Left:  (BLES) Pain - part of body: Leg     Time: [id number] PT Time Calculation (min) (ACUTE ONLY): 14 min  Charges:  $Gait Training: 8-22 mins                    G Codes:       Mylo RedShauna Andree Heeg, PT, DPT [phone]     Blake DivineShauna A Lanissa Cashen 09/30/2016, 8:30 AM

## 2016-09-30 NOTE — Discharge Summary (Signed)
Date of Admission: 09/29/2016  Date of Discharge: 09/30/16  PRE-OPERATIVE DIAGNOSIS:  Recurrent lumbar disc herniation L5-S1 with back and leg pain  POST-OPERATIVE DIAGNOSIS:  same  PROCEDURE:  Anterior lumbar interbody fusion L5-S1 utilizing a globus peek interbody cage packed with morcellized allograft, followed by fixation with 3 separate vertebral body pins placed through the device  Attending: Tia AlertJones, David S, MD  Hospital Course:  The patient was admitted for the above listed operation and had an uncomplicated post-operative course.  They were discharged in stable condition.  Follow up: 3 weeks  Allergies as of 09/30/2016      Reactions   No Known Allergies       Medication List    TAKE these medications   cetirizine 10 MG tablet Commonly known as:  ZYRTEC Take 10 mg by mouth daily as needed for allergies.   HYDROcodone-acetaminophen 5-325 MG tablet Commonly known as:  NORCO/VICODIN Take 1 tablet by mouth at bedtime as needed for moderate pain.   methocarbamol 750 MG tablet Commonly known as:  ROBAXIN Take 1 tablet (750 mg total) by mouth every 6 (six) hours as needed for muscle spasms.   omeprazole 20 MG capsule Commonly known as:  PRILOSEC Take 40 mg by mouth daily.   oxyCODONE-acetaminophen 5-325 MG tablet Commonly known as:  ROXICET Take 1-2 tablets by mouth every 4 (four) hours as needed for severe pain.            Durable Medical Equipment        Start     Ordered   09/29/16 1228  DME Walker rolling  Once    Question:  Patient needs a walker to treat with the following condition  Answer:  S/P lumbar fusion   09/29/16 1227

## 2016-09-30 NOTE — Progress Notes (Signed)
Patient is discharged from room 3C06 at this time. Alert and in stable condition. IV site d/c'd and instructions read to patient and wife with understanding verbalized. Ambulate out of unit with family and belongings at side.

## 2016-10-03 ENCOUNTER — Encounter (HOSPITAL_COMMUNITY): Payer: Self-pay | Admitting: Neurological Surgery

## 2017-03-05 ENCOUNTER — Other Ambulatory Visit: Payer: Self-pay | Admitting: Neurological Surgery

## 2017-03-12 NOTE — Pre-Procedure Instructions (Addendum)
Kyle CanaryBrandon Hoover  03/12/2017      [facility] [place] - [address] [place] [id number] Phone: [phone] Fax: [phone]  [facility] [address] [place], [place]- [phone] [address] [place] [id number] Phone: [phone] Fax: [phone]    Your procedure is scheduled on October 25  [facility].M.  Call this number if you have problems the morning of surgery:  [phone]   Remember:  Do not eat food or drink liquids after midnight.  Continue all other medications as directed by your physician except follow these medication instructions before surgery    Take these medicines the morning of surgery with A SIP OF WATER  DULoxetine (CYMBALTA HYDROcodone-acetaminophen (NORCO/VICODIN)  methocarbamol (ROBAXIN) nabumetone (RELAFEN)  omeprazole (PRILOSEC)  7 days prior to surgery STOP taking any Aspirin (unless otherwise instructed by your surgeon), Aleve, Naproxen, Ibuprofen, Motrin, Advil, Goody's, BC's, all herbal medications, fish oil, and all vitamins    Do not wear jewelry  Do not wear lotions, powders, or cologne, or deoderant.  Men may shave face and neck.  Do not bring valuables to the hospital.  [facility] is not responsible for any belongings or valuables.  Contacts, dentures or bridgework may not be worn into surgery.  Leave your suitcase in the car.  After surgery it may be brought to your room.  For patients admitted to the hospital, discharge time will be determined by your treatment team.  Patients discharged the day of surgery will not be allowed to drive home.    Special instructions:   Kyle Hoover- Preparing For Surgery  Before surgery, you can play an important role. Because skin is not sterile, your skin needs to be as free of germs as possible. You can reduce the number of germs on your skin by washing with CHG (chlorahexidine gluconate) Soap before surgery.  CHG is  an antiseptic cleaner which kills germs and bonds with the skin to continue killing germs even after washing.  Please do not use if you have an allergy to CHG or antibacterial soaps. If your skin becomes reddened/irritated stop using the CHG.  Do not shave (including legs and underarms) for at least 48 hours prior to first CHG shower. It is OK to shave your face.  Please follow these instructions carefully.   1. Shower the NIGHT BEFORE SURGERY and the MORNING OF SURGERY with CHG.   2. If you chose to wash your hair, wash your hair first as usual with your normal shampoo.  3. After you shampoo, rinse your hair and body thoroughly to remove the shampoo.  4. Use CHG as you would any other liquid soap. You can apply CHG directly to the skin and wash gently with a scrungie or a clean washcloth.   5. Apply the CHG Soap to your body ONLY FROM THE NECK DOWN.  Do not use on open wounds or open sores. Avoid contact with your eyes, ears, mouth and genitals (private parts). Wash Face and genitals (private parts)  with your normal soap.  6. Wash thoroughly, paying special attention to the area where your surgery will be performed.  7. Thoroughly rinse your body with warm water from the neck down.  8. DO NOT shower/wash with your normal soap after using and rinsing off the CHG Soap.  9. Pat yourself dry with a CLEAN TOWEL.  10. Wear CLEAN PAJAMAS to bed  the night before surgery, wear comfortable clothes the morning of surgery  11. Place CLEAN SHEETS on your bed the night of your first shower and DO NOT SLEEP WITH PETS.    Day of Surgery: Do not apply any deodorants/lotions. Please wear clean clothes to the hospital/surgery center.      Please read over the following fact sheets that you were given.

## 2017-03-13 ENCOUNTER — Encounter (HOSPITAL_COMMUNITY): Payer: Self-pay

## 2017-03-13 ENCOUNTER — Ambulatory Visit (HOSPITAL_COMMUNITY)
Admission: RE | Admit: 2017-03-13 | Discharge: 2017-03-13 | Disposition: A | Discharge status: Home / Self Care | Payer: 59 | Source: Ambulatory Visit | Attending: Neurological Surgery | Admitting: Neurological Surgery

## 2017-03-13 ENCOUNTER — Encounter (HOSPITAL_COMMUNITY)
Admission: RE | Admit: 2017-03-13 | Discharge: 2017-03-13 | Disposition: A | Discharge status: Home / Self Care | Payer: 59 | Source: Ambulatory Visit | Attending: Neurological Surgery | Admitting: Neurological Surgery

## 2017-03-13 DIAGNOSIS — M545 Low back pain, unspecified: Secondary | ICD-10-CM

## 2017-03-13 HISTORY — DX: Depression, unspecified: F32.A

## 2017-03-13 HISTORY — DX: Major depressive disorder, single episode, unspecified: F32.9

## 2017-03-13 LAB — TYPE AND SCREEN
ABO/RH(D): A POS
Antibody Screen: NEGATIVE

## 2017-03-13 LAB — BASIC METABOLIC PANEL
ANION GAP: 10 (ref 5–15)
BUN: 12 mg/dL (ref 6–20)
CALCIUM: 9 mg/dL (ref 8.9–10.3)
CHLORIDE: 105 mmol/L (ref 101–111)
CO2: 20 mmol/L — AB (ref 22–32)
Creatinine, Ser: 0.81 mg/dL (ref 0.61–1.24)
GFR calc Af Amer: >60 mL/min (ref >60)
GFR calc non Af Amer: >60 mL/min (ref >60)
GLUCOSE: 121 mg/dL — AB (ref 65–99)
POTASSIUM: 3.8 mmol/L (ref 3.5–5.1)
Sodium: 135 mmol/L (ref 135–145)

## 2017-03-13 LAB — CBC WITH DIFFERENTIAL/PLATELET
Basophils Absolute: 0 10*3/uL (ref 0.0–0.1)
Basophils Relative: 0 %
Eosinophils Absolute: 0.1 10*3/uL (ref 0.0–0.7)
Eosinophils Relative: 2 %
HEMATOCRIT: 43.8 % (ref 39.0–52.0)
Hemoglobin: 14.9 g/dL (ref 13.0–17.0)
LYMPHS ABS: 1.9 10*3/uL (ref 0.7–4.0)
LYMPHS PCT: 34 %
MCH: 30.8 pg (ref 26.0–34.0)
MCHC: 34 g/dL (ref 30.0–36.0)
MCV: 90.5 fL (ref 78.0–100.0)
MONO ABS: 0.3 10*3/uL (ref 0.1–1.0)
MONOS PCT: 5 %
NEUTROS ABS: 3.2 10*3/uL (ref 1.7–7.7)
Neutrophils Relative %: 59 %
Platelets: 286 10*3/uL (ref 150–400)
RBC: 4.84 MIL/uL (ref 4.22–5.81)
RDW: 13 % (ref 11.5–15.5)
WBC: 5.5 10*3/uL (ref 4.0–10.5)

## 2017-03-13 LAB — PROTIME-INR
INR: 0.95
Prothrombin Time: 12.6 seconds (ref 11.4–15.2)

## 2017-03-13 LAB — SURGICAL PCR SCREEN
MRSA, PCR: NEGATIVE
Staphylococcus aureus: POSITIVE — AB

## 2017-03-13 NOTE — Progress Notes (Signed)
PCP - Lianne MorisErin Carroll Cardiologist - denies  Chest x-ray - not needed EKG - not needed Stress Test - denies ECHO - denies Cardiac Cath - denies    Patient denies shortness of breath, fever, cough and chest pain at PAT appointment   Patient verbalized understanding of instructions that were given to them at the PAT appointment. Patient was also instructed that they will need to review over the PAT instructions again at home before surgery.

## 2017-03-15 ENCOUNTER — Inpatient Hospital Stay (HOSPITAL_COMMUNITY): Payer: 59 | Admitting: Anesthesiology

## 2017-03-15 ENCOUNTER — Inpatient Hospital Stay (HOSPITAL_COMMUNITY): Payer: 59

## 2017-03-15 ENCOUNTER — Inpatient Hospital Stay (HOSPITAL_COMMUNITY)
Admission: RE | Admit: 2017-03-15 | Discharge: 2017-03-16 | DRG: 460 | Disposition: A | Discharge status: Home / Self Care | Payer: 59 | Source: Ambulatory Visit | Attending: Neurological Surgery | Admitting: Neurological Surgery

## 2017-03-15 ENCOUNTER — Encounter (HOSPITAL_COMMUNITY): Payer: Self-pay | Admitting: Certified Registered Nurse Anesthetist

## 2017-03-15 ENCOUNTER — Encounter (HOSPITAL_COMMUNITY)
Admission: RE | Disposition: A | Discharge status: Home / Self Care | Payer: Self-pay | Source: Ambulatory Visit | Attending: Neurological Surgery

## 2017-03-15 DIAGNOSIS — Z79899 Other long term (current) drug therapy: Secondary | ICD-10-CM | POA: Diagnosis not present

## 2017-03-15 DIAGNOSIS — K219 Gastro-esophageal reflux disease without esophagitis: Secondary | ICD-10-CM | POA: Diagnosis not present

## 2017-03-15 DIAGNOSIS — Z9189 Other specified personal risk factors, not elsewhere classified: Secondary | ICD-10-CM

## 2017-03-15 DIAGNOSIS — Z981 Arthrodesis status: Secondary | ICD-10-CM | POA: Diagnosis not present

## 2017-03-15 DIAGNOSIS — M47896 Other spondylosis, lumbar region: Secondary | ICD-10-CM | POA: Diagnosis present

## 2017-03-15 DIAGNOSIS — M79606 Pain in leg, unspecified: Secondary | ICD-10-CM | POA: Diagnosis present

## 2017-03-15 DIAGNOSIS — Z87891 Personal history of nicotine dependence: Secondary | ICD-10-CM | POA: Diagnosis not present

## 2017-03-15 DIAGNOSIS — M5126 Other intervertebral disc displacement, lumbar region: Secondary | ICD-10-CM | POA: Diagnosis not present

## 2017-03-15 DIAGNOSIS — Z419 Encounter for procedure for purposes other than remedying health state, unspecified: Secondary | ICD-10-CM

## 2017-03-15 DIAGNOSIS — F329 Major depressive disorder, single episode, unspecified: Secondary | ICD-10-CM | POA: Diagnosis not present

## 2017-03-15 DIAGNOSIS — M40299 Other kyphosis, site unspecified: Secondary | ICD-10-CM | POA: Diagnosis present

## 2017-03-15 DIAGNOSIS — G8929 Other chronic pain: Secondary | ICD-10-CM | POA: Diagnosis not present

## 2017-03-15 DIAGNOSIS — M48061 Spinal stenosis, lumbar region without neurogenic claudication: Secondary | ICD-10-CM | POA: Diagnosis present

## 2017-03-15 DIAGNOSIS — M549 Dorsalgia, unspecified: Secondary | ICD-10-CM | POA: Diagnosis not present

## 2017-03-15 SURGERY — POSTERIOR LUMBAR FUSION 1 LEVEL
Anesthesia: General | Site: Back

## 2017-03-15 MED ORDER — NEOSTIGMINE METHYLSULFATE 5 MG/5ML IV SOSY
PREFILLED_SYRINGE | INTRAVENOUS | Status: AC
  Filled 2017-03-15: qty 5

## 2017-03-15 MED ORDER — PROPOFOL 10 MG/ML IV BOLUS
INTRAVENOUS | Status: AC
  Filled 2017-03-15: qty 40

## 2017-03-15 MED ORDER — CEFAZOLIN SODIUM-DEXTROSE 2-4 GM/100ML-% IV SOLN
2.0000 g | INTRAVENOUS | Status: AC
  Administered 2017-03-15: 2 g via INTRAVENOUS

## 2017-03-15 MED ORDER — MORPHINE SULFATE (PF) 4 MG/ML IV SOLN
2.0000 mg | INTRAVENOUS | Status: DC | PRN
  Administered 2017-03-15: 2 mg via INTRAVENOUS
  Filled 2017-03-15: qty 1

## 2017-03-15 MED ORDER — THROMBIN (RECOMBINANT) 5000 UNITS EX SOLR
CUTANEOUS | Status: AC
  Filled 2017-03-15: qty 5000

## 2017-03-15 MED ORDER — ACETAMINOPHEN 650 MG RE SUPP: 650.0000 mg | RECTAL | Status: DC | PRN

## 2017-03-15 MED ORDER — 0.9 % SODIUM CHLORIDE (POUR BTL) OPTIME
TOPICAL | Status: DC | PRN
  Administered 2017-03-15: 1000 mL

## 2017-03-15 MED ORDER — OXYCODONE-ACETAMINOPHEN 5-325 MG PO TABS
1.0000 | ORAL_TABLET | ORAL | Status: DC | PRN
  Administered 2017-03-15 – 2017-03-16 (×6): 2 via ORAL
  Filled 2017-03-15 (×6): qty 2

## 2017-03-15 MED ORDER — FENTANYL CITRATE (PF) 250 MCG/5ML IJ SOLN
INTRAMUSCULAR | Status: AC
  Filled 2017-03-15: qty 5

## 2017-03-15 MED ORDER — HYDROCODONE-ACETAMINOPHEN 5-325 MG PO TABS
1.0000 | ORAL_TABLET | ORAL | Status: DC | PRN
Start: 2017-03-15 — End: 2017-03-15

## 2017-03-15 MED ORDER — SCOPOLAMINE 1 MG/3DAYS TD PT72
MEDICATED_PATCH | TRANSDERMAL | Status: DC | PRN
  Administered 2017-03-15: 1 via TRANSDERMAL

## 2017-03-15 MED ORDER — ONDANSETRON HCL 4 MG PO TABS: 4.0000 mg | ORAL_TABLET | Freq: Four times a day (QID) | ORAL | Status: DC | PRN

## 2017-03-15 MED ORDER — OXYCODONE HCL 5 MG PO TABS
ORAL_TABLET | ORAL | Status: AC
  Filled 2017-03-15: qty 1

## 2017-03-15 MED ORDER — FENTANYL CITRATE (PF) 100 MCG/2ML IJ SOLN
INTRAMUSCULAR | Status: AC
  Filled 2017-03-15: qty 2

## 2017-03-15 MED ORDER — ROCURONIUM BROMIDE 10 MG/ML (PF) SYRINGE
PREFILLED_SYRINGE | INTRAVENOUS | Status: AC
  Filled 2017-03-15: qty 5

## 2017-03-15 MED ORDER — VANCOMYCIN HCL 1000 MG IV SOLR
INTRAVENOUS | Status: DC | PRN
  Administered 2017-03-15: 1000 mg via TOPICAL

## 2017-03-15 MED ORDER — DEXAMETHASONE SODIUM PHOSPHATE 10 MG/ML IJ SOLN
INTRAMUSCULAR | Status: AC
  Filled 2017-03-15: qty 1

## 2017-03-15 MED ORDER — ONDANSETRON HCL 4 MG/2ML IJ SOLN: 4.0000 mg | Freq: Once | INTRAMUSCULAR | Status: DC | PRN

## 2017-03-15 MED ORDER — THROMBIN (RECOMBINANT) 20000 UNITS EX SOLR
CUTANEOUS | Status: DC | PRN
  Administered 2017-03-15: 09:00:00 via TOPICAL

## 2017-03-15 MED ORDER — MENTHOL 3 MG MT LOZG: 1.0000 | LOZENGE | OROMUCOSAL | Status: DC | PRN

## 2017-03-15 MED ORDER — CHLORHEXIDINE GLUCONATE CLOTH 2 % EX PADS: 6.0000 | MEDICATED_PAD | Freq: Once | CUTANEOUS | Status: DC

## 2017-03-15 MED ORDER — CELECOXIB 200 MG PO CAPS
200.0000 mg | ORAL_CAPSULE | Freq: Two times a day (BID) | ORAL | Status: DC
  Administered 2017-03-15 – 2017-03-16 (×3): 200 mg via ORAL
  Filled 2017-03-15 (×3): qty 1

## 2017-03-15 MED ORDER — SCOPOLAMINE 1 MG/3DAYS TD PT72
MEDICATED_PATCH | TRANSDERMAL | Status: AC
  Filled 2017-03-15: qty 1

## 2017-03-15 MED ORDER — PHENYLEPHRINE HCL 10 MG/ML IJ SOLN
INTRAMUSCULAR | Status: DC | PRN
  Administered 2017-03-15 (×3): 80 ug via INTRAVENOUS

## 2017-03-15 MED ORDER — OXYCODONE HCL 5 MG PO TABS
5.0000 mg | ORAL_TABLET | Freq: Once | ORAL | Status: AC | PRN
  Administered 2017-03-15: 5 mg via ORAL

## 2017-03-15 MED ORDER — LIDOCAINE 2% (20 MG/ML) 5 ML SYRINGE
INTRAMUSCULAR | Status: AC
  Filled 2017-03-15: qty 5

## 2017-03-15 MED ORDER — ACETAMINOPHEN 325 MG PO TABS: 650.0000 mg | ORAL_TABLET | ORAL | Status: DC | PRN

## 2017-03-15 MED ORDER — OXYCODONE HCL 5 MG/5ML PO SOLN: 5.0000 mg | Freq: Once | ORAL | Status: AC | PRN

## 2017-03-15 MED ORDER — POTASSIUM CHLORIDE IN NACL 20-0.9 MEQ/L-% IV SOLN: INTRAVENOUS | Status: DC

## 2017-03-15 MED ORDER — PHENYLEPHRINE 40 MCG/ML (10ML) SYRINGE FOR IV PUSH (FOR BLOOD PRESSURE SUPPORT)
PREFILLED_SYRINGE | INTRAVENOUS | Status: AC
  Filled 2017-03-15: qty 10

## 2017-03-15 MED ORDER — CEFAZOLIN SODIUM-DEXTROSE 2-4 GM/100ML-% IV SOLN
INTRAVENOUS | Status: AC
  Filled 2017-03-15: qty 100

## 2017-03-15 MED ORDER — LACTATED RINGERS IV SOLN
INTRAVENOUS | Status: DC | PRN
  Administered 2017-03-15 (×2): via INTRAVENOUS

## 2017-03-15 MED ORDER — METHOCARBAMOL 500 MG PO TABS
500.0000 mg | ORAL_TABLET | Freq: Four times a day (QID) | ORAL | Status: DC | PRN
  Administered 2017-03-15 – 2017-03-16 (×5): 500 mg via ORAL
  Filled 2017-03-15 (×3): qty 1

## 2017-03-15 MED ORDER — MIDAZOLAM HCL 5 MG/5ML IJ SOLN
INTRAMUSCULAR | Status: DC | PRN
  Administered 2017-03-15: 2 mg via INTRAVENOUS

## 2017-03-15 MED ORDER — DEXAMETHASONE SODIUM PHOSPHATE 10 MG/ML IJ SOLN
10.0000 mg | INTRAMUSCULAR | Status: AC
  Administered 2017-03-15: 10 mg via INTRAVENOUS

## 2017-03-15 MED ORDER — SODIUM CHLORIDE 0.9% FLUSH
3.0000 mL | Freq: Two times a day (BID) | INTRAVENOUS | Status: DC
  Administered 2017-03-15: 3 mL via INTRAVENOUS

## 2017-03-15 MED ORDER — ONDANSETRON HCL 4 MG/2ML IJ SOLN: 4.0000 mg | Freq: Four times a day (QID) | INTRAMUSCULAR | Status: DC | PRN

## 2017-03-15 MED ORDER — METHOCARBAMOL 1000 MG/10ML IJ SOLN
500.0000 mg | Freq: Four times a day (QID) | INTRAVENOUS | Status: DC | PRN
  Filled 2017-03-15: qty 5

## 2017-03-15 MED ORDER — DIAZEPAM 5 MG/ML IJ SOLN
INTRAMUSCULAR | Status: AC
  Filled 2017-03-15: qty 2

## 2017-03-15 MED ORDER — DIAZEPAM 5 MG/ML IJ SOLN
2.5000 mg | Freq: Once | INTRAMUSCULAR | Status: AC
  Administered 2017-03-15: 2.5 mg via INTRAVENOUS

## 2017-03-15 MED ORDER — NEOSTIGMINE METHYLSULFATE 5 MG/5ML IV SOSY
PREFILLED_SYRINGE | INTRAVENOUS | Status: DC | PRN
  Administered 2017-03-15: 4 mg via INTRAVENOUS

## 2017-03-15 MED ORDER — ONDANSETRON HCL 4 MG/2ML IJ SOLN
INTRAMUSCULAR | Status: AC
  Filled 2017-03-15: qty 2

## 2017-03-15 MED ORDER — GLYCOPYRROLATE 0.2 MG/ML IV SOSY
PREFILLED_SYRINGE | INTRAVENOUS | Status: DC | PRN
  Administered 2017-03-15: 0.6 mg via INTRAVENOUS

## 2017-03-15 MED ORDER — SODIUM CHLORIDE 0.9% FLUSH: 3.0000 mL | INTRAVENOUS | Status: DC | PRN

## 2017-03-15 MED ORDER — LIDOCAINE 2% (20 MG/ML) 5 ML SYRINGE
INTRAMUSCULAR | Status: DC | PRN
  Administered 2017-03-15: 40 mg via INTRAVENOUS

## 2017-03-15 MED ORDER — BUPIVACAINE HCL (PF) 0.25 % IJ SOLN
INTRAMUSCULAR | Status: DC | PRN
  Administered 2017-03-15: 7 mL

## 2017-03-15 MED ORDER — THROMBIN (RECOMBINANT) 5000 UNITS EX SOLR
OROMUCOSAL | Status: DC | PRN
  Administered 2017-03-15: 09:00:00 via TOPICAL

## 2017-03-15 MED ORDER — VANCOMYCIN HCL 1000 MG IV SOLR
INTRAVENOUS | Status: AC
  Filled 2017-03-15: qty 1000

## 2017-03-15 MED ORDER — SENNA 8.6 MG PO TABS
1.0000 | ORAL_TABLET | Freq: Two times a day (BID) | ORAL | Status: DC
Start: 2017-03-15 — End: 2017-03-16
  Administered 2017-03-15 – 2017-03-16 (×2): 8.6 mg via ORAL
  Filled 2017-03-15 (×2): qty 1

## 2017-03-15 MED ORDER — ROCURONIUM BROMIDE 10 MG/ML (PF) SYRINGE
PREFILLED_SYRINGE | INTRAVENOUS | Status: DC | PRN
  Administered 2017-03-15: 50 mg via INTRAVENOUS
  Administered 2017-03-15: 25 mg via INTRAVENOUS
  Administered 2017-03-15: 50 mg via INTRAVENOUS

## 2017-03-15 MED ORDER — METHOCARBAMOL 500 MG PO TABS
ORAL_TABLET | ORAL | Status: AC
  Filled 2017-03-15: qty 1

## 2017-03-15 MED ORDER — BUPIVACAINE HCL (PF) 0.25 % IJ SOLN
INTRAMUSCULAR | Status: AC
  Filled 2017-03-15: qty 30

## 2017-03-15 MED ORDER — ONDANSETRON HCL 4 MG/2ML IJ SOLN
INTRAMUSCULAR | Status: DC | PRN
  Administered 2017-03-15: 4 mg via INTRAVENOUS

## 2017-03-15 MED ORDER — PROPOFOL 10 MG/ML IV BOLUS
INTRAVENOUS | Status: DC | PRN
  Administered 2017-03-15: 200 mg via INTRAVENOUS

## 2017-03-15 MED ORDER — BUSPIRONE HCL 10 MG PO TABS
10.0000 mg | ORAL_TABLET | Freq: Every day | ORAL | Status: DC
  Filled 2017-03-15: qty 1

## 2017-03-15 MED ORDER — SODIUM CHLORIDE 0.9 % IR SOLN
Status: DC | PRN
  Administered 2017-03-15: 09:00:00

## 2017-03-15 MED ORDER — SODIUM CHLORIDE 0.9 % IV SOLN: 250.0000 mL | INTRAVENOUS | Status: DC

## 2017-03-15 MED ORDER — THROMBIN (RECOMBINANT) 20000 UNITS EX SOLR
CUTANEOUS | Status: AC
  Filled 2017-03-15: qty 20000

## 2017-03-15 MED ORDER — DULOXETINE HCL 30 MG PO CPEP
60.0000 mg | ORAL_CAPSULE | Freq: Every evening | ORAL | Status: DC
  Administered 2017-03-15: 60 mg via ORAL
  Filled 2017-03-15: qty 2

## 2017-03-15 MED ORDER — CEFAZOLIN SODIUM-DEXTROSE 2-4 GM/100ML-% IV SOLN
2.0000 g | Freq: Three times a day (TID) | INTRAVENOUS | Status: AC
  Administered 2017-03-15 (×2): 2 g via INTRAVENOUS
  Filled 2017-03-15 (×2): qty 100

## 2017-03-15 MED ORDER — PHENOL 1.4 % MT LIQD: 1.0000 | OROMUCOSAL | Status: DC | PRN

## 2017-03-15 MED ORDER — FENTANYL CITRATE (PF) 100 MCG/2ML IJ SOLN: 25.0000 ug | INTRAMUSCULAR | Status: DC | PRN

## 2017-03-15 MED ORDER — FENTANYL CITRATE (PF) 100 MCG/2ML IJ SOLN
INTRAMUSCULAR | Status: DC | PRN
  Administered 2017-03-15: 25 ug via INTRAVENOUS
  Administered 2017-03-15 (×3): 50 ug via INTRAVENOUS
  Administered 2017-03-15: 25 ug via INTRAVENOUS
  Administered 2017-03-15: 100 ug via INTRAVENOUS
  Administered 2017-03-15: 50 ug via INTRAVENOUS
  Administered 2017-03-15: 25 ug via INTRAVENOUS
  Administered 2017-03-15 (×2): 50 ug via INTRAVENOUS
  Administered 2017-03-15: 25 ug via INTRAVENOUS

## 2017-03-15 MED ORDER — EPHEDRINE SULFATE 50 MG/ML IJ SOLN
INTRAMUSCULAR | Status: DC | PRN
  Administered 2017-03-15: 10 mg via INTRAVENOUS

## 2017-03-15 MED ORDER — GABAPENTIN 300 MG PO CAPS
300.0000 mg | ORAL_CAPSULE | Freq: Every day | ORAL | Status: DC
  Administered 2017-03-15: 300 mg via ORAL
  Filled 2017-03-15: qty 1

## 2017-03-15 MED ORDER — MIDAZOLAM HCL 2 MG/2ML IJ SOLN
INTRAMUSCULAR | Status: AC
  Filled 2017-03-15: qty 2

## 2017-03-15 SURGICAL SUPPLY — 57 items
ATEC PORO TIPS 10D 9W 25X9X11 (Bone Implant) ×6 IMPLANT
BAG DECANTER FOR FLEXI CONT (MISCELLANEOUS) ×3 IMPLANT
BASKET BONE COLLECTION (BASKET) ×3 IMPLANT
BENZOIN TINCTURE PRP APPL 2/3 (GAUZE/BANDAGES/DRESSINGS) ×3 IMPLANT
BLADE CLIPPER SURG (BLADE) ×3 IMPLANT
BUR MATCHSTICK NEURO 3.0 LAGG (BURR) ×3 IMPLANT
CANISTER SUCT 3000ML PPV (MISCELLANEOUS) ×3 IMPLANT
CARTRIDGE OIL MAESTRO DRILL (MISCELLANEOUS) ×1 IMPLANT
CLOSURE WOUND 1/2 X4 (GAUZE/BANDAGES/DRESSINGS) ×2
CONT SPEC 4OZ CLIKSEAL STRL BL (MISCELLANEOUS) ×3 IMPLANT
COVER BACK TABLE 60X90IN (DRAPES) ×3 IMPLANT
DERMABOND ADVANCED (GAUZE/BANDAGES/DRESSINGS) ×2
DERMABOND ADVANCED .7 DNX12 (GAUZE/BANDAGES/DRESSINGS) ×1 IMPLANT
DIFFUSER DRILL AIR PNEUMATIC (MISCELLANEOUS) ×3 IMPLANT
DRAPE C-ARM 42X72 X-RAY (DRAPES) ×6 IMPLANT
DRAPE C-ARMOR (DRAPES) ×3 IMPLANT
DRAPE LAPAROTOMY 100X72X124 (DRAPES) ×3 IMPLANT
DRAPE POUCH INSTRU U-SHP 10X18 (DRAPES) ×3 IMPLANT
DRAPE SURG 17X23 STRL (DRAPES) ×3 IMPLANT
DRSG OPSITE POSTOP 4X6 (GAUZE/BANDAGES/DRESSINGS) ×3 IMPLANT
DURAPREP 26ML APPLICATOR (WOUND CARE) ×3 IMPLANT
ELECT REM PT RETURN 9FT ADLT (ELECTROSURGICAL) ×3
ELECTRODE REM PT RTRN 9FT ADLT (ELECTROSURGICAL) ×1 IMPLANT
EVACUATOR 1/8 PVC DRAIN (DRAIN) IMPLANT
GAUZE SPONGE 4X4 16PLY XRAY LF (GAUZE/BANDAGES/DRESSINGS) IMPLANT
GLOVE BIO SURGEON STRL SZ7 (GLOVE) ×3 IMPLANT
GLOVE BIO SURGEON STRL SZ8 (GLOVE) ×6 IMPLANT
GLOVE BIOGEL PI IND STRL 7.0 (GLOVE) ×1 IMPLANT
GLOVE BIOGEL PI INDICATOR 7.0 (GLOVE) ×2
GOWN STRL REUS W/ TWL LRG LVL3 (GOWN DISPOSABLE) IMPLANT
GOWN STRL REUS W/ TWL XL LVL3 (GOWN DISPOSABLE) ×2 IMPLANT
GOWN STRL REUS W/TWL 2XL LVL3 (GOWN DISPOSABLE) IMPLANT
GOWN STRL REUS W/TWL LRG LVL3 (GOWN DISPOSABLE)
GOWN STRL REUS W/TWL XL LVL3 (GOWN DISPOSABLE) ×4
HEMOSTAT POWDER KIT SURGIFOAM (HEMOSTASIS) ×3 IMPLANT
KIT BASIN OR (CUSTOM PROCEDURE TRAY) ×3 IMPLANT
KIT ROOM TURNOVER OR (KITS) ×3 IMPLANT
NEEDLE HYPO 25X1 1.5 SAFETY (NEEDLE) ×3 IMPLANT
NS IRRIG 1000ML POUR BTL (IV SOLUTION) ×3 IMPLANT
OIL CARTRIDGE MAESTRO DRILL (MISCELLANEOUS) ×3
PACK LAMINECTOMY NEURO (CUSTOM PROCEDURE TRAY) ×3 IMPLANT
PAD ARMBOARD 7.5X6 YLW CONV (MISCELLANEOUS) ×9 IMPLANT
PUTTY DBM ALLOSYNC PURE 10CC (Putty) ×3 IMPLANT
ROD PC 5.5X40 TI ARSENAL (Rod) ×6 IMPLANT
SCREW CORT CBX 5.5X40 (Screw) ×12 IMPLANT
SCREW SET SPINAL ARSENAL 47127 (Screw) ×12 IMPLANT
SPONGE LAP 4X18 X RAY DECT (DISPOSABLE) IMPLANT
SPONGE SURGIFOAM ABS GEL 100 (HEMOSTASIS) ×3 IMPLANT
STRIP CLOSURE SKIN 1/2X4 (GAUZE/BANDAGES/DRESSINGS) ×4 IMPLANT
SUT VIC AB 0 CT1 18XCR BRD8 (SUTURE) ×1 IMPLANT
SUT VIC AB 0 CT1 8-18 (SUTURE) ×2
SUT VIC AB 2-0 CP2 18 (SUTURE) ×3 IMPLANT
SUT VIC AB 3-0 SH 8-18 (SUTURE) ×9 IMPLANT
TOWEL GREEN STERILE (TOWEL DISPOSABLE) ×3 IMPLANT
TOWEL GREEN STERILE FF (TOWEL DISPOSABLE) ×3 IMPLANT
TRAY FOLEY W/METER SILVER 16FR (SET/KITS/TRAYS/PACK) ×3 IMPLANT
WATER STERILE IRR 1000ML POUR (IV SOLUTION) ×3 IMPLANT

## 2017-03-15 NOTE — Op Note (Signed)
03/15/2017  11:02 AM  PATIENT:  Kyle BoersBrandon C Hoover  31 y.o. 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 Decompressive lumbar laminectomy L4-5 requiring more work than would be required for a simple exposure of the disk for PLIF in order to adequately decompress the neural elements and address the spinal stenosis 2. Posterior lumbar interbody fusion L4-5 using porous titanium interbody cages packed with morcellized allograft and autograft 3. Posterior fixation L4-5 using Alphatec cortical pedicle screws.    SURGEON:  Marikay Alaravid Rashied Corallo, MD  ASSISTANTSAdelene Idler: Meyran FNP  ANESTHESIA:  General  EBL: 150 ml  Total I/O In: 1500 [I.V.:1500] Out: 625 [Urine:475; Blood:150]  BLOOD ADMINISTERED:none  DRAINS: none   INDICATION FOR PROCEDURE: This patient presented with back and leg pain. Imaging revealed adjacent level spondylosis with kyphosis and stenosis. The patient tried a reasonable attempt at conservative medical measures without relief. I recommended decompression and instrumented fusion to address the stenosis as well as the segmental  instability.  Patient understood the risks, benefits, and alternatives and potential outcomes and wished to proceed.  PROCEDURE DETAILS:  The patient was brought to the operating room. After induction of generalized endotracheal anesthesia the patient was rolled into the prone position on chest rolls and all pressure points were padded. The patient's lumbar region was cleaned and then prepped with DuraPrep and draped in the usual sterile fashion. Anesthesia was injected and then a dorsal midline incision was made and carried down to the lumbosacral fascia. The fascia was opened and the paraspinous musculature was taken down in a subperiosteal fashion to expose L4-5. A  self-retaining retractor was placed. Intraoperative fluoroscopy confirmed my level, and I started with placement of the L4 cortical pedicle screws. The pedicle screw entry zones were identified utilizing surface landmarks and  AP and lateral fluoroscopy. I scored the cortex with the high-speed drill and then used the hand drill to drill an upward and outward direction into the pedicle. I then tapped line to line. I then placed a 5.5 x 40 mm cortical pedicle screw into the pedicles of L4 bilaterally. I then turned my attention to the decompression and complete lumbar laminectomies, hemi- facetectomies, and foraminotomies were performed at L4-5. The patient had significant spinal stenosis and this required more work than would be required for a simple exposure of the disc for posterior lumbar interbody fusion which would only require a limited laminotomy. Much more generous decompression and generous foraminotomy was undertaken in order to adequately decompress the neural elements and address the patient's leg pain. The yellow ligament was removed to expose the underlying dura and nerve roots, and generous foraminotomies were performed to adequately decompress the neural elements. Both the exiting and traversing nerve roots were decompressed on both sides until a coronary dilator passed easily along the nerve roots. Once the decompression was complete, I turned my attention to the posterior lower lumbar interbody fusion. The epidural venous vasculature was coagulated and cut sharply. There was a recurrent disc herniation at L4-5 on the right.  Disc space was incised and the initial discectomy was performed with pituitary rongeurs. The disc space was distracted with sequential distractors to a height of 11 mm. We then used a series of scrapers and shavers to prepare the endplates for fusion. The midline was prepared with Epstein curettes. Once the complete discectomy was finished, we packed  an appropriate sized  interbody cage with local autograft and morcellized allograft, gently retracted the nerve root, and tapped the cage into position at L4-5.  The midline between the cages was packed with morselized autograft and allograft. We then turned our attention to the placement of the lower pedicle screws. The pedicle screw entry zones were identified utilizing surface landmarks and fluoroscopy. I drilled into each pedicle utilizing the hand drill, and tapped each pedicle with the appropriate tap. We palpated with a ball probe to assure no break in the cortex. We then placed 5.5 x 40 mm cortical pedicle screws into the pedicles bilaterally at L5.  We then placed lordotic rods into the multiaxial screw heads of the pedicle screws and locked these in position with the locking caps and anti-torque device. We then checked our construct with AP and lateral fluoroscopy. Irrigated with copious amounts of bacitracin-containing saline solution. Inspected the nerve roots once again to assure adequate decompression, lined to the dura with Gelfoam, placed powdered vancomycin into the wound, and closed the muscle and the fascia with 0 Vicryl. Closed the subcutaneous tissues with 2-0 Vicryl and subcuticular tissues with 3-0 Vicryl. The skin was closed with benzoin and Steri-Strips. Dressing was then applied, the patient was awakened from general anesthesia and transported to the recovery room in stable condition. At the end of the procedure all sponge, needle and instrument counts were correct.   PLAN OF CARE: admit to inpatient  PATIENT DISPOSITION:  PACU - hemodynamically stable.   Delay start of Pharmacological VTE agent (>24hrs) due to surgical blood loss or risk of bleeding:  yes

## 2017-03-15 NOTE — Transfer of Care (Signed)
Immediate Anesthesia Transfer of Care Note  Patient: Kyle Hoover  Procedure(s) Performed: Posterior Lumbar Interbody Fusion - Lumbar Four-Lumbar Five (N/A Back)  Patient Location: PACU  Anesthesia Type:General  Level of Consciousness: patient cooperative and responds to stimulation  Airway & Oxygen Therapy: Patient Spontanous Breathing and Patient connected to face mask oxygen  Post-op Assessment: Report given to RN, Post -op Vital signs reviewed and stable and Patient moving all extremities X 4  Post vital signs: Reviewed and stable  Last Vitals:  Vitals:   03/15/17 0600  BP: (!) 148/91  Pulse: 86  Resp: 20  Temp: 36.7 C  SpO2: 98%    Last Pain:  Vitals:   03/15/17 0600  TempSrc: Oral  PainSc: 6       Patients Stated Pain Goal: 4 (03/15/17 0600)  Complications: No apparent anesthesia complications

## 2017-03-15 NOTE — Evaluation (Signed)
Physical Therapy Evaluation Patient Details Name: Kyle Hoover MRN: [id number] DOB: 1985/11/02 Today's Date: 03/15/2017   History of Present Illness  Pt is a 31 y/o male s/p L4-5 PLIF. PMH includes depression, GERD, and previous back surgery   Clinical Impression  Patient is s/p above surgery resulting in the deficits listed below (see PT Problem List). PTA, pt was independent with mobility. Upon eval, pt limited by pain, weakness, and decreased balance. Required min guard assist and use of IV pole and rails in hallway for ambulation this session. Educated about use of cane to increase stability, and pt agreeable. Reports family will be available 24/7 at d/c. Patient will benefit from skilled PT to increase their independence and safety with mobility (while adhering to their precautions) to allow discharge to the venue listed below. Will continue to follow acutely to maximize functional mobility independence and safety.      Follow Up Recommendations No PT follow up;Supervision for mobility/OOB    Equipment Recommendations  Cane    Recommendations for Other Services       Precautions / Restrictions Precautions Precautions: Back Precaution Booklet Issued: Yes (comment) Precaution Comments: Reviewed back precautions with pt.  Required Braces or Orthoses: Spinal Brace Spinal Brace: Lumbar corset;Applied in sitting position Restrictions Weight Bearing Restrictions: No      Mobility  Bed Mobility               General bed mobility comments: Standing in room upon entry   Transfers Overall transfer level: Needs assistance Equipment used: None Transfers: Sit to/from Stand Sit to Stand: Min guard         General transfer comment: Increased time required to sit secondary to pain. Min guard for safety.   Ambulation/Gait Ambulation/Gait assistance: Min guard Ambulation Distance (Feet): 300 Feet Assistive device:  (IV pole; use of handrail) Gait  Pattern/deviations: Step-through pattern;Decreased stride length Gait velocity: Decreased Gait velocity interpretation: Below normal speed for age/gender General Gait Details: Slight knee buckling noted bilaterally secondary to pain. Educated about use of cane to increase stability and pt agreeable. Demonstrated how to properly use cane.   Stairs            Wheelchair Mobility    Modified Rankin (Stroke Patients Only)       Balance Overall balance assessment: Needs assistance Sitting-balance support: No upper extremity supported;Feet supported Sitting balance-Leahy Scale: Good     Standing balance support: Bilateral upper extremity supported;During functional activity Standing balance-Leahy Scale: Poor Standing balance comment: Reliant on UE support during gait for balance.                              Pertinent Vitals/Pain Pain Assessment: Faces Faces Pain Scale: Hurts whole lot Pain Location: back  Pain Descriptors / Indicators: Aching;Operative site guarding;Grimacing Pain Intervention(s): Limited activity within patient's tolerance;Monitored during session;Repositioned    Home Living Family/patient expects to be discharged to:: Private residence Living Arrangements: Spouse/significant other;Children Available Help at Discharge: Family;Available 24 hours/day Type of Home: Mobile home Home Access: Stairs to enter Entrance Stairs-Rails: Right Entrance Stairs-Number of Steps: 4 Home Layout: One level Home Equipment: None      Prior Function Level of Independence: Independent               Hand Dominance        Extremity/Trunk Assessment   Upper Extremity Assessment Upper Extremity Assessment: Defer to OT evaluation    Lower Extremity  Assessment Lower Extremity Assessment: RLE deficits/detail;LLE deficits/detail RLE Deficits / Details: Reports knee issues at baseline.  LLE Deficits / Details: Reports knee issues at baseline.      Cervical / Trunk Assessment Cervical / Trunk Assessment: Other exceptions Cervical / Trunk Exceptions: s/p PLIF   Communication   Communication: No difficulties  Cognition Arousal/Alertness: Awake/alert Behavior During Therapy: WFL for tasks assessed/performed Overall Cognitive Status: Within Functional Limits for tasks assessed                                        General Comments General comments (skin integrity, edema, etc.): Educated about generalized walking program to perform at home.     Exercises     Assessment/Plan    PT Assessment Patient needs continued PT services  PT Problem List Decreased strength;Decreased balance;Decreased mobility;Decreased knowledge of use of DME;Decreased knowledge of precautions;Pain       PT Treatment Interventions DME instruction;Gait training;Stair training;Therapeutic activities;Functional mobility training;Therapeutic exercise;Balance training;Neuromuscular re-education;Patient/family education    PT Goals (Current goals can be found in the Care Plan section)  Acute Rehab PT Goals Patient Stated Goal: to decrease pain  PT Goal Formulation: With patient Time For Goal Achievement: 03/22/17 Potential to Achieve Goals: Good    Frequency Min 5X/week   Barriers to discharge        Co-evaluation               AM-PAC PT "6 Clicks" Daily Activity  Outcome Measure Difficulty turning over in bed (including adjusting bedclothes, sheets and blankets)?: A Little Difficulty moving from lying on back to sitting on the side of the bed? : A Little Difficulty sitting down on and standing up from a chair with arms (e.g., wheelchair, bedside commode, etc,.)?: Unable Help needed moving to and from a bed to chair (including a wheelchair)?: A Little Help needed walking in hospital room?: A Little Help needed climbing 3-5 steps with a railing? : A Little 6 Click Score: 16    End of Session Equipment Utilized During  Treatment: Gait belt;Back brace Activity Tolerance: Patient tolerated treatment well Patient left: in bed;with call bell/phone within reach;with family/visitor present (sitting EOB ) Nurse Communication: Mobility status PT Visit Diagnosis: Other abnormalities of gait and mobility (R26.89);Pain Pain - part of body: Knee (back )    Time: [id number] PT Time Calculation (min) (ACUTE ONLY): 16 min   Charges:   PT Evaluation $PT Eval Low Complexity: 1 Low     PT G Codes:        Gladys DammeBrittany Tobin Cadiente, PT, DPT  Acute Rehabilitation Services  Pager: [phone]   Lehman PromBrittany S Giannis Corpuz 03/15/2017, 2:04 PM

## 2017-03-15 NOTE — Anesthesia Postprocedure Evaluation (Signed)
Anesthesia Post Note  Patient: Kyle Hoover  Procedure(s) Performed: Posterior Lumbar Interbody Fusion - Lumbar Four-Lumbar Five (N/A Back)     Patient location during evaluation: PACU Anesthesia Type: General Level of consciousness: awake, awake and alert and oriented Pain management: pain level controlled Vital Signs Assessment: post-procedure vital signs reviewed and stable Respiratory status: spontaneous breathing, nonlabored ventilation and respiratory function stable Cardiovascular status: blood pressure returned to baseline Anesthetic complications: no    Last Vitals:  Vitals:   03/15/17 1115 03/15/17 1130  BP: 129/75 (!) 104/54  Pulse: (!) 104 88  Resp: 12 11  Temp:    SpO2: 94% 97%    Last Pain:  Vitals:   03/15/17 0600  TempSrc: Oral  PainSc: 6                  Cornisha Zetino COKER

## 2017-03-15 NOTE — Anesthesia Preprocedure Evaluation (Addendum)
Anesthesia Evaluation  Patient identified by MRN, date of birth, ID band Patient awake    Reviewed: Allergy & Precautions, NPO status , Patient's Chart, lab work & pertinent test results  Airway Mallampati: II  TM Distance: >3 FB Neck ROM: Full    Dental  (+) Teeth Intact, Dental Advisory Given   Pulmonary former smoker,    breath sounds clear to auscultation       Cardiovascular  Rhythm:Regular Rate:Normal     Neuro/Psych    GI/Hepatic   Endo/Other    Renal/GU      Musculoskeletal   Abdominal   Peds  Hematology   Anesthesia Other Findings   Reproductive/Obstetrics                             Anesthesia Physical Anesthesia Plan  ASA: II  Anesthesia Plan: General   Post-op Pain Management:    Induction: Intravenous  PONV Risk Score and Plan: Ondansetron, Dexamethasone and Scopolamine patch - Pre-op  Airway Management Planned: Oral ETT  Additional Equipment:   Intra-op Plan:   Post-operative Plan: Extubation in OR  Informed Consent: I have reviewed the patients History and Physical, chart, labs and discussed the procedure including the risks, benefits and alternatives for the proposed anesthesia with the patient or authorized representative who has indicated his/her understanding and acceptance.   Dental advisory given  Plan Discussed with: CRNA and Anesthesiologist  Anesthesia Plan Comments: (H/O Post-op nausea and vomiting)        Anesthesia Quick Evaluation

## 2017-03-15 NOTE — H&P (Signed)
Subjective: Patient is a 31 y.o. male admitted for back and leg apin. Onset of symptoms was several months ago, gradually worsening since that time.  The pain is rated severe, and is located at the across the lower back and radiates to legs. The pain is described as aching and occurs all day. The symptoms have been progressive. Symptoms are exacerbated by exercise. 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 Chronic back pain   . Depression   . Family history of adverse reaction to anesthesia    dad gets sick  . GERD (gastroesophageal reflux disease)    takes Omeprazole daily  . PONV (postoperative nausea and vomiting)     Past Surgical History:  Procedure Laterality Date  . ABDOMINAL EXPOSURE N/A 09/29/2016   Procedure: ABDOMINAL EXPOSURE;  Surgeon: Nada Libman, MD;  Location: [facility] OR;  Service: Vascular;  Laterality: N/A;  . ANTERIOR LUMBAR FUSION N/A 09/29/2016   Procedure: Anterior Lumbar Interbody Fusion Lumbar five-Sacral one;  Surgeon: Tia Alert, MD;  Location: [facility] OR;  Service: Neurosurgery;  Laterality: N/A;  . BACK SURGERY     x 2  . HERNIA REPAIR     as a baby    Prior to Admission medications   Medication Sig Start Date End Date Taking? Authorizing Provider  busPIRone (BUSPAR) 10 MG tablet Take 10 mg by mouth at bedtime.    Yes [provider]  DULoxetine (CYMBALTA) 60 MG capsule Take 60 mg by mouth every evening.  02/05/17  Yes [provider]  gabapentin (NEURONTIN) 300 MG capsule Take 300 mg by mouth at bedtime. 01/29/17  Yes [provider]  HYDROcodone-acetaminophen (NORCO/VICODIN) 5-325 MG tablet Take 1-2 tablets by mouth every 4 (four) hours as needed for moderate pain or severe pain (depends on pain if takes 1-2 tablets and takes a dose every morning and agian only if needed for pain).    Yes [provider]  methocarbamol (ROBAXIN) 750 MG tablet Take 1 tablet (750 mg total) by  mouth every 6 (six) hours as needed for muscle spasms. Patient taking differently: Take 750 mg by mouth 2 (two) times daily.  09/30/16  Yes Ditty, Loura Halt, MD  nabumetone (RELAFEN) 500 MG tablet Take 500 mg by mouth 2 (two) times daily. 01/29/17  Yes [provider]  omeprazole (PRILOSEC) 40 MG capsule 40 mg.  01/29/17  Yes [provider]  fexofenadine (ALLEGRA) 30 MG tablet Take 30 mg by mouth daily.     [provider]  ondansetron (ZOFRAN) 8 MG tablet Take 8 mg by mouth daily as needed for nausea/vomiting. 02/20/17   [provider]  oxyCODONE-acetaminophen (ROXICET) 5-325 MG tablet Take 1-2 tablets by mouth every 4 (four) hours as needed for severe pain. Patient not taking: Reported on 03/09/2017 09/30/16   Ditty, Loura Halt, MD  zolpidem Remus Loffler) 10 MG tablet  01/04/17   [provider]   Allergies  Allergen Reactions  . No Known Allergies     Social History  Substance Use Topics  . Smoking status: Former Games developer  . Smokeless tobacco: Current User    Types: Chew     Comment: quit smoking 11 yrs ago  . Alcohol use No    Family History  Problem Relation Age of Onset  . Stroke Mother   . Heart failure Mother   . Asthma Mother   . Asthma Father   . Heart failure  Father      Review of Systems  Positive ROS: neg  All other systems have been reviewed and were otherwise negative with the exception of those mentioned in the HPI and as above.  Objective: Vital signs in last 24 hours: Temp:  [98.1 F (36.7 C)] 98.1 F (36.7 C) (10/25 0600) Pulse Rate:  [86] 86 (10/25 0600) Resp:  [20] 20 (10/25 0600) BP: (148)/(91) 148/91 (10/25 0600) SpO2:  [98 %] 98 % (10/25 0600) Weight:  [95.4 kg (210 lb 4.8 oz)] 95.4 kg (210 lb 4.8 oz) (10/25 0604)  General Appearance: Alert, cooperative, no distress, appears stated age Head: Normocephalic, without obvious abnormality, atraumatic Eyes: PERRL, conjunctiva/corneas clear, EOM's intact     Neck: Supple, symmetrical, trachea midline Back: Symmetric, no curvature, ROM normal, no CVA tenderness Lungs:  respirations unlabored Heart: Regular rate and rhythm Abdomen: Soft, non-tender Extremities: Extremities normal, atraumatic, no cyanosis or edema Pulses: 2+ and symmetric all extremities Skin: Skin color, texture, turgor normal, no rashes or lesions  NEUROLOGIC:   Mental status: Alert and oriented x4,  no aphasia, good attention span, fund of knowledge, and memory Motor Exam - grossly normal Sensory Exam - grossly normal Reflexes: 1+ Coordination - grossly normal Gait - grossly normal Balance - grossly normal Cranial Nerves: I: smell Not tested  II: visual acuity  OS: nl    OD: nl  II: visual fields Full to confrontation  II: pupils Equal, round, reactive to light  III,VII: ptosis None  III,IV,VI: extraocular muscles  Full ROM  V: mastication Normal  V: facial light touch sensation  Normal  V,VII: corneal reflex  Present  VII: facial muscle function - upper  Normal  VII: facial muscle function - lower Normal  VIII: hearing Not tested  IX: soft palate elevation  Normal  IX,X: gag reflex Present  XI: trapezius strength  5/5  XI: sternocleidomastoid strength 5/5  XI: neck flexion strength  5/5  XII: tongue strength  Normal    Data Review Lab Results  Component Value Date   WBC 5.5 03/13/2017   HGB 14.9 03/13/2017   HCT 43.8 03/13/2017   MCV 90.5 03/13/2017   PLT 286 03/13/2017   Lab Results  Component Value Date   NA 135 03/13/2017   K 3.8 03/13/2017   CL 105 03/13/2017   CO2 20 (L) 03/13/2017   BUN 12 03/13/2017   CREATININE 0.81 03/13/2017   GLUCOSE 121 (H) 03/13/2017   Lab Results  Component Value Date   INR 0.95 03/13/2017    Assessment/Plan: Patient admitted for PLIF L4-5. Patient has failed a reasonable attempt at conservative therapy.  I explained the condition and procedure to the patient and answered any questions.  Patient wishes to  proceed with procedure as planned. Understands risks/ benefits and typical outcomes of procedure.   Kyle Hoover S 03/15/2017 7:28 AM

## 2017-03-16 MED ORDER — METHOCARBAMOL 750 MG PO TABS: 750.0000 mg | ORAL_TABLET | Freq: Four times a day (QID) | ORAL | 2 refills | Status: AC | PRN

## 2017-03-16 MED ORDER — HYDROCODONE-ACETAMINOPHEN 5-325 MG PO TABS: 1.0000 | ORAL_TABLET | Freq: Four times a day (QID) | ORAL | 0 refills | Status: AC | PRN

## 2017-03-16 NOTE — Progress Notes (Addendum)
OT Note Addendum for Charges    03/16/17 0911  OT Time Calculation  OT Start Time (ACUTE ONLY) 0858  OT Stop Time (ACUTE ONLY) 0908  OT Time Calculation (min) 10 min  OT General Charges  $OT Visit 1 Visit  OT Evaluation  $OT Eval Low Complexity 1 Low   Marijke Guadiana A. Brett Albinooffey, M.S., OTR/L Pager: [phone]

## 2017-03-16 NOTE — Progress Notes (Signed)
Physical Therapy Treatment Patient Details Name: Kyle Hoover MRN: [id number] DOB: 1986/05/22 Today's Date: 03/16/2017    History of Present Illness Pt is a 31 y/o male s/p L4-5 PLIF. PMH includes depression, GERD, and previous back surgery     PT Comments    Pt progressing towards physical therapy goals. Was able to perform transfers and ambulation with gross modified independence to independence and no AD. Pt has met all acute PT goals, and was educated on brace application/wearing schedule, precautions, car transfer, and general safety with activity progression. Will sign off at this time. If needs change, please reconsult.    Follow Up Recommendations  No PT follow up;Supervision for mobility/OOB     Equipment Recommendations  None recommended by PT    Recommendations for Other Services       Precautions / Restrictions Precautions Precautions: Back Precaution Booklet Issued: Yes (comment) Precaution Comments: Reviewed back precautions with pt.  Required Braces or Orthoses: Spinal Brace Spinal Brace: Lumbar corset;Applied in sitting position Restrictions Weight Bearing Restrictions: No    Mobility  Bed Mobility               General bed mobility comments: Pt was received walking in hall.   Transfers Overall transfer level: Independent Equipment used: None Transfers: Sit to/from Stand Sit to Stand: Min guard         General transfer comment: Increased time required to sit secondary to pain. Min guard for safety.   Ambulation/Gait Ambulation/Gait assistance: Modified independent (Device/Increase time) Ambulation Distance (Feet): 500 Feet Assistive device: None Gait Pattern/deviations: Step-through pattern;Decreased stride length Gait velocity: Decreased Gait velocity interpretation: Below normal speed for age/gender General Gait Details: No assist required and no unsteadiness/LOB noted.    Stairs Stairs: Yes   Stair Management: One rail  Left;Alternating pattern;Forwards Number of Stairs: 10 General stair comments: Pt demonstrated proper sequencing and safety.   Wheelchair Mobility    Modified Rankin (Stroke Patients Only)       Balance Overall balance assessment: Needs assistance Sitting-balance support: No upper extremity supported;Feet supported Sitting balance-Leahy Scale: Good     Standing balance support: No upper extremity supported Standing balance-Leahy Scale: Good                              Cognition Arousal/Alertness: Awake/alert Behavior During Therapy: WFL for tasks assessed/performed Overall Cognitive Status: Within Functional Limits for tasks assessed                                        Exercises      General Comments General comments (skin integrity, edema, etc.): Pt reports he primarily stays at home during the days      Pertinent Vitals/Pain Pain Assessment: Faces Pain Score: 7  Faces Pain Scale: Hurts a little bit Pain Location: back  Pain Descriptors / Indicators: Aching;Operative site guarding;Grimacing Pain Intervention(s): Monitored during session    Home Living Family/patient expects to be discharged to:: Private residence Living Arrangements: Other relatives (brother and sister in Sports coach ) Available Help at Discharge: Family;Available 24 hours/day Type of Home: Mobile home Home Access: Stairs to enter Entrance Stairs-Rails: Right Home Layout: One level Home Equipment: Shower seat      Prior Function Level of Independence: Independent          PT Goals (current goals can now be  found in the care plan section) Acute Rehab PT Goals Patient Stated Goal: To get better  PT Goal Formulation: With patient Time For Goal Achievement: 03/22/17 Potential to Achieve Goals: Good Progress towards PT goals: Progressing toward goals    Frequency    Min 5X/week      PT Plan Current plan remains appropriate    Co-evaluation               AM-PAC PT "6 Clicks" Daily Activity  Outcome Measure  Difficulty turning over in bed (including adjusting bedclothes, sheets and blankets)?: None Difficulty moving from lying on back to sitting on the side of the bed? : None Difficulty sitting down on and standing up from a chair with arms (e.g., wheelchair, bedside commode, etc,.)?: None Help needed moving to and from a bed to chair (including a wheelchair)?: None Help needed walking in hospital room?: None Help needed climbing 3-5 steps with a railing? : None 6 Click Score: 24    End of Session Equipment Utilized During Treatment: Back brace Activity Tolerance: Patient tolerated treatment well Patient left: in bed;with call bell/phone within reach;with family/visitor present (sitting EOB ) Nurse Communication: Mobility status PT Visit Diagnosis: Other abnormalities of gait and mobility (R26.89);Pain Pain - part of body:  (back )     Time: 1610-9604 PT Time Calculation (min) (ACUTE ONLY): 15 min  Charges:  $Gait Training: 8-22 mins                    G Codes:       Rolinda Roan, PT, DPT Acute Rehabilitation Services Pager: [phone]    Thelma Comp 03/16/2017, 1:04 PM

## 2017-03-16 NOTE — Therapy (Signed)
Occupational Therapy Evaluation Patient Details Name: Kyle Hoover MRN: [id number] DOB: Jul 10, 1985 Today's Date: 03/16/2017    History of Present Illness Pt is a 31 y/o male s/p L4-5 PLIF. PMH includes depression, GERD, and previous back surgery    Clinical Impression   Pt reports being independent in ADLs PTA. Currently, pt requires min assist for LB ADLs and supervision for functional mobility and all other ADLs. Pt reports family is available to provide assistance as needed upon d/c home. Pt would benefit from acute OT services to increase independence with LB ADLs. OT will follow acutely to address established goals.     Follow Up Recommendations  No OT follow up;Supervision - Intermittent    Equipment Recommendations  None recommended by OT    Recommendations for Other Services       Precautions / Restrictions Precautions Precautions: Back Precaution Booklet Issued: Yes (comment) Precaution Comments: Reviewed back precautions with pt.  Required Braces or Orthoses: Spinal Brace Spinal Brace: Lumbar corset;Applied in sitting position Restrictions Weight Bearing Restrictions: No      Mobility Bed Mobility               General bed mobility comments: Sitting in chair upon OT arrival   Transfers Overall transfer level: Needs assistance Equipment used: None Transfers: Sit to/from Stand Sit to Stand: Min guard         General transfer comment: Increased time required to sit secondary to pain. Min guard for safety.     Balance Overall balance assessment: Needs assistance Sitting-balance support: No upper extremity supported;Feet supported Sitting balance-Leahy Scale: Good     Standing balance support: No upper extremity supported Standing balance-Leahy Scale: Good                             ADL either performed or assessed with clinical judgement   ADL Overall ADL's : Needs assistance/impaired Eating/Feeding: Independent    Grooming: Supervision/safety;Standing   Upper Body Bathing: Supervision/ safety;Sitting   Lower Body Bathing: Minimal assistance;Sit to/from stand   Upper Body Dressing : Supervision/safety;Sitting   Lower Body Dressing: Minimal assistance;Sit to/from stand   Toilet Transfer: Supervision/safety;Ambulation Toilet Transfer Details (indicate cue type and reason): Simulated sit to stand from chair          Functional mobility during ADLs: Supervision/safety General ADL Comments: Pt educated on compensatory techniques for ADLs with back precautions. Pt currently unable to bring feet over knees for LB ADLs. Pt reports family is available to assist as needed. Pt may benefit from AE to increase independence      Vision         Perception     Praxis      Pertinent Vitals/Pain Pain Assessment: 0-10 Pain Score: 7  Pain Location: back  Pain Descriptors / Indicators: Aching;Operative site guarding;Grimacing Pain Intervention(s): Monitored during session;Limited activity within patient's tolerance     Hand Dominance     Extremity/Trunk Assessment Upper Extremity Assessment Upper Extremity Assessment: Overall WFL for tasks assessed   Lower Extremity Assessment Lower Extremity Assessment: Defer to PT evaluation   Cervical / Trunk Assessment Cervical / Trunk Assessment: Other exceptions Cervical / Trunk Exceptions: s/p PLIF    Communication Communication Communication: No difficulties   Cognition Arousal/Alertness: Awake/alert Behavior During Therapy: WFL for tasks assessed/performed Overall Cognitive Status: Within Functional Limits for tasks assessed  General Comments  Pt reports he primarily stays at home during the days    Exercises     Shoulder Instructions      Home Living Family/patient expects to be discharged to:: Private residence Living Arrangements: Other relatives (brother and sister in law ) Available  Help at Discharge: Family;Available 24 hours/day Type of Home: Mobile home Home Access: Stairs to enter Entrance Stairs-Number of Steps: 4 Entrance Stairs-Rails: Right Home Layout: One level     Bathroom Shower/Tub: Walk-in Pensions consultant: Handicapped height Bathroom Accessibility: Yes How Accessible: Accessible via walker Home Equipment: Shower seat          Prior Functioning/Environment Level of Independence: Independent                 OT Problem List: Decreased knowledge of use of DME or AE;Decreased knowledge of precautions;Pain      OT Treatment/Interventions: Self-care/ADL training;Therapeutic activities;Patient/family education;Balance training    OT Goals(Current goals can be found in the care plan section) Acute Rehab OT Goals Patient Stated Goal: To get better  OT Goal Formulation: With patient Time For Goal Achievement: 03/30/17 Potential to Achieve Goals: Good ADL Goals Pt Will Perform Lower Body Bathing: with modified independence;sit to/from stand (AE as needed) Pt Will Perform Lower Body Dressing: with modified independence;sit to/from stand (AE as needed) Pt Will Perform Tub/Shower Transfer: with modified independence;ambulating;shower seat  OT Frequency: Min 2X/week   Barriers to D/C:            Co-evaluation              AM-PAC PT "6 Clicks" Daily Activity     Outcome Measure Help from another person eating meals?: None Help from another person taking care of personal grooming?: None Help from another person toileting, which includes using toliet, bedpan, or urinal?: None Help from another person bathing (including washing, rinsing, drying)?: A Little Help from another person to put on and taking off regular upper body clothing?: None Help from another person to put on and taking off regular lower body clothing?: A Little 6 Click Score: 22   End of Session Equipment Utilized During Treatment: Gait belt Nurse  Communication: Mobility status  Activity Tolerance: Patient tolerated treatment well Patient left:  (standing in room, breakfast coming in )  OT Visit Diagnosis: Other abnormalities of gait and mobility (R26.89);Pain Pain - part of body:  (Back)                Time: 6213-0865 OT Time Calculation (min): 10 min Charges:  OT General Charges $OT Visit: 1 Visit OT Evaluation $OT Eval Low Complexity: 1 Low G-Codes:     Cammy Copa, OTS [phone]   Cammy Copa 03/16/2017, 9:44 AM

## 2017-03-16 NOTE — Progress Notes (Signed)
Patient alert and oriented, mae's well, voiding adequate amount of urine, swallowing without difficulty, c/o mild pain at time of discharge. Patient discharged home with family. Script and discharged instructions given to patient. Patient and family stated understanding of instructions given. Patient has an appointment with Dr. Jones   

## 2017-03-16 NOTE — Discharge Summary (Signed)
Physician Discharge Summary  Patient ID: Kyle Hoover MRN: [id number] DOB/AGE: [birth year] 31 y.o.  Admit date: 03/15/2017 Discharge date: 03/16/2017  Admission Diagnoses: adjacent level dz    Discharge Diagnoses: same   Discharged Condition: good  Hospital Course: The patient was admitted on 03/15/2017 and taken to the operating room where the patient underwent PLIF L4-5. The patient tolerated the procedure well and was taken to the recovery room and then to the floor in stable condition. The hospital course was routine. There were no complications. The wound remained clean dry and intact. Pt had appropriate back soreness. No complaints of leg pain or new N/T/W. The patient remained afebrile with stable vital signs, and tolerated a regular diet. The patient continued to increase activities, and pain was well controlled with oral pain medications.   Consults: None  Significant Diagnostic Studies:  Results for orders placed or performed during the hospital encounter of 03/13/17  Surgical pcr screen  Result Value Ref Range   MRSA, PCR NEGATIVE NEGATIVE   Staphylococcus aureus POSITIVE (A) NEGATIVE  Basic metabolic panel  Result Value Ref Range   Sodium 135 135 - 145 mmol/L   Potassium 3.8 3.5 - 5.1 mmol/L   Chloride 105 101 - 111 mmol/L   CO2 20 (L) 22 - 32 mmol/L   Glucose, Bld 121 (H) 65 - 99 mg/dL   BUN 12 6 - 20 mg/dL   Creatinine, Ser 6.290.81 0.61 - 1.24 mg/dL   Calcium 9.0 8.9 - 52.810.3 mg/dL   GFR calc non Af Amer >60 >60 mL/min   GFR calc Af Amer >60 >60 mL/min   Anion gap 10 5 - 15  CBC WITH DIFFERENTIAL  Result Value Ref Range   WBC 5.5 4.0 - 10.5 K/uL   RBC 4.84 4.22 - 5.81 MIL/uL   Hemoglobin 14.9 13.0 - 17.0 g/dL   HCT 41.343.8 24.439.0 - 01.052.0 %   MCV 90.5 78.0 - 100.0 fL   MCH 30.8 26.0 - 34.0 pg   MCHC 34.0 30.0 - 36.0 g/dL   RDW 27.213.0 53.611.5 - 64.415.5 %   Platelets 286 150 - 400 K/uL   Neutrophils Relative % 59 %   Neutro Abs 3.2 1.7 - 7.7 K/uL   Lymphocytes  Relative 34 %   Lymphs Abs 1.9 0.7 - 4.0 K/uL   Monocytes Relative 5 %   Monocytes Absolute 0.3 0.1 - 1.0 K/uL   Eosinophils Relative 2 %   Eosinophils Absolute 0.1 0.0 - 0.7 K/uL   Basophils Relative 0 %   Basophils Absolute 0.0 0.0 - 0.1 K/uL  Protime-INR  Result Value Ref Range   Prothrombin Time 12.6 11.4 - 15.2 seconds   INR 0.95   [facility]  Result Value Ref Range   ABO/RH(D) A POS    Antibody Screen NEG    Sample Expiration 03/27/2017    Extend sample reason NO TRANSFUSIONS OR PREGNANCY IN THE PAST 3 MONTHS     Chest 2 View  Result Date: 03/13/2017 CLINICAL DATA:  Preoperative evaluation for upcoming lumbar surgery EXAM: CHEST  2 VIEW COMPARISON:  09/19/2016 FINDINGS: The heart size and mediastinal contours are within normal limits. Both lungs are clear. The visualized skeletal structures are unremarkable. IMPRESSION: No active cardiopulmonary disease. Electronically Signed   By: Alcide CleverMark  Lukens M.D.   On: 03/13/2017 16:17   Dg Lumbar Spine 2-3 Views  Result Date: 03/15/2017 CLINICAL DATA:  Intraoperative imaging for L4-5 laminectomy and fusion. EXAM: LUMBAR  SPINE - 2-3 VIEW; DG C-ARM 61-120 MIN COMPARISON:  MRI lumbar spine 02/22/2017. FINDINGS: Two fluoroscopic intraoperative spot views of the lumbar spine demonstrate pedicle screws, stabilization bars and interbody spacer in place at L4-5. Hardware is intact. No acute finding. Prior L5-S1 anterior fusion noted. IMPRESSION: Intraoperative imaging for L4-5 fusion.  No acute finding. Electronically Signed   By: Drusilla Kanner M.D.   On: 03/15/2017 10:42   Dg C-arm 1-60 Min  Result Date: 03/15/2017 CLINICAL DATA:  Intraoperative imaging for L4-5 laminectomy and fusion. EXAM: LUMBAR SPINE - 2-3 VIEW; DG C-ARM 61-120 MIN COMPARISON:  MRI lumbar spine 02/22/2017. FINDINGS: Two fluoroscopic intraoperative spot views of the lumbar spine demonstrate pedicle screws, stabilization bars and interbody  spacer in place at L4-5. Hardware is intact. No acute finding. Prior L5-S1 anterior fusion noted. IMPRESSION: Intraoperative imaging for L4-5 fusion.  No acute finding. Electronically Signed   By: Drusilla Kanner M.D.   On: 03/15/2017 10:42    Antibiotics:  Anti-infectives    Start     Dose/Rate Route Frequency Ordered Stop   03/15/17 1600  ceFAZolin (ANCEF) IVPB 2g/100 mL premix     2 g 200 mL/hr over 30 Minutes Intravenous Every 8 hours 03/15/17 1219 03/15/17 2323   03/15/17 1017  vancomycin (VANCOCIN) powder  Status:  Discontinued       As needed 03/15/17 1018 03/15/17 1052   03/15/17 0904  bacitracin 50,000 Units in sodium chloride irrigation 0.9 % 500 mL irrigation  Status:  Discontinued       As needed 03/15/17 0904 03/15/17 1052   03/15/17 0553  ceFAZolin (ANCEF) 2-4 GM/100ML-% IVPB    Comments:  Ray Church   : cabinet override      03/15/17 0553 03/15/17 0749   03/15/17 0549  ceFAZolin (ANCEF) IVPB 2g/100 mL premix     2 g 200 mL/hr over 30 Minutes Intravenous On call to O.R. 03/15/17 0549 03/15/17 0819      Discharge Exam: Blood pressure 114/72, pulse 86, temperature 98.4 F (36.9 C), resp. rate 16, weight 95.4 kg (210 lb 4.8 oz), SpO2 99 %. Neurologic: Grossly normal Dressing dry  Discharge Medications:   Allergies as of 03/16/2017      Reactions   No Known Allergies       Medication List    STOP taking these medications   oxyCODONE-acetaminophen 5-325 MG tablet Commonly known as:  ROXICET     TAKE these medications   busPIRone 10 MG tablet Commonly known as:  BUSPAR Take 10 mg by mouth at bedtime.   DULoxetine 60 MG capsule Commonly known as:  CYMBALTA Take 60 mg by mouth every evening.   fexofenadine 30 MG tablet Commonly known as:  ALLEGRA Take 30 mg by mouth daily.   gabapentin 300 MG capsule Commonly known as:  NEURONTIN Take 300 mg by mouth at bedtime.   HYDROcodone-acetaminophen 5-325 MG tablet Commonly known as:  NORCO/VICODIN Take  1-2 tablets by mouth every 6 (six) hours as needed for moderate pain or severe pain (depends on pain if takes 1-2 tablets and takes a dose every morning and agian only if needed for pain). What changed:  when to take this   methocarbamol 750 MG tablet Commonly known as:  ROBAXIN Take 1 tablet (750 mg total) by mouth every 6 (six) hours as needed for muscle spasms. What changed:  when to take this  reasons to take this   nabumetone 500 MG tablet Commonly known as:  RELAFEN Take  500 mg by mouth 2 (two) times daily.   omeprazole 40 MG capsule Commonly known as:  PRILOSEC 40 mg.   ondansetron 8 MG tablet Commonly known as:  ZOFRAN Take 8 mg by mouth daily as needed for nausea/vomiting.   zolpidem 10 MG tablet Commonly known as:  [employer]        Start     Ordered   03/15/17 1220  DME Walker rolling  Once    Question:  Patient needs a walker to treat with the following condition  Answer:  S/P lumbar fusion   03/15/17 1219   03/15/17 1220  DME 3 n 1  Once     03/15/17 1219      Disposition: home   Final Dx: PLIF L4-5  Discharge Instructions    Call MD for:  difficulty breathing, headache or visual disturbances    Complete by:  As directed    Call MD for:  persistant nausea and vomiting    Complete by:  As directed    Call MD for:  redness, tenderness, or signs of infection (pain, swelling, redness, odor or green/yellow discharge around incision site)    Complete by:  As directed    Call MD for:  severe uncontrolled pain    Complete by:  As directed    Call MD for:  temperature >100.4    Complete by:  As directed    Diet - low sodium heart healthy    Complete by:  As directed    Increase activity slowly    Complete by:  As directed    Remove dressing in 48 hours    Complete by:  As directed          Signed: Yailin Biederman S 03/16/2017, 9:13 AM

## 2017-03-20 ENCOUNTER — Encounter (HOSPITAL_COMMUNITY): Payer: Self-pay | Admitting: Neurological Surgery

## 2018-04-29 DIAGNOSIS — Z6830 Body mass index (BMI) 30.0-30.9, adult: Secondary | ICD-10-CM | POA: Diagnosis not present

## 2018-04-29 DIAGNOSIS — R030 Elevated blood-pressure reading, without diagnosis of hypertension: Secondary | ICD-10-CM | POA: Diagnosis not present

## 2018-04-29 DIAGNOSIS — M545 Low back pain: Secondary | ICD-10-CM | POA: Diagnosis not present

## 2018-05-01 ENCOUNTER — Other Ambulatory Visit: Payer: Self-pay | Admitting: Neurological Surgery

## 2018-05-01 DIAGNOSIS — M545 Low back pain, unspecified: Secondary | ICD-10-CM

## 2018-05-16 DIAGNOSIS — J0101 Acute recurrent maxillary sinusitis: Secondary | ICD-10-CM | POA: Diagnosis not present

## 2018-05-16 DIAGNOSIS — Z6829 Body mass index (BMI) 29.0-29.9, adult: Secondary | ICD-10-CM | POA: Diagnosis not present

## 2018-05-16 DIAGNOSIS — J069 Acute upper respiratory infection, unspecified: Secondary | ICD-10-CM | POA: Diagnosis not present

## 2018-05-31 DIAGNOSIS — Z6829 Body mass index (BMI) 29.0-29.9, adult: Secondary | ICD-10-CM | POA: Diagnosis not present

## 2018-05-31 DIAGNOSIS — N50812 Left testicular pain: Secondary | ICD-10-CM | POA: Diagnosis not present

## 2018-06-12 DIAGNOSIS — K219 Gastro-esophageal reflux disease without esophagitis: Secondary | ICD-10-CM | POA: Diagnosis not present

## 2018-07-02 DIAGNOSIS — N430 Encysted hydrocele: Secondary | ICD-10-CM | POA: Diagnosis not present

## 2018-07-02 DIAGNOSIS — R1030 Lower abdominal pain, unspecified: Secondary | ICD-10-CM | POA: Diagnosis not present

## 2018-07-26 DIAGNOSIS — M961 Postlaminectomy syndrome, not elsewhere classified: Secondary | ICD-10-CM | POA: Diagnosis not present

## 2018-07-26 DIAGNOSIS — M545 Low back pain: Secondary | ICD-10-CM | POA: Diagnosis not present

## 2018-08-09 DIAGNOSIS — Z6829 Body mass index (BMI) 29.0-29.9, adult: Secondary | ICD-10-CM | POA: Diagnosis not present

## 2018-08-09 DIAGNOSIS — L0390 Cellulitis, unspecified: Secondary | ICD-10-CM | POA: Diagnosis not present

## 2018-08-09 DIAGNOSIS — J019 Acute sinusitis, unspecified: Secondary | ICD-10-CM | POA: Diagnosis not present

## 2018-08-13 DIAGNOSIS — G43909 Migraine, unspecified, not intractable, without status migrainosus: Secondary | ICD-10-CM | POA: Diagnosis not present

## 2018-09-16 DIAGNOSIS — K219 Gastro-esophageal reflux disease without esophagitis: Secondary | ICD-10-CM | POA: Diagnosis not present

## 2019-01-13 ENCOUNTER — Other Ambulatory Visit: Payer: Self-pay

## 2019-01-13 DIAGNOSIS — Z20822 Contact with and (suspected) exposure to covid-19: Secondary | ICD-10-CM

## 2019-01-14 LAB — NOVEL CORONAVIRUS, NAA: SARS-CoV-2, NAA: DETECTED — AB

## 2021-10-14 ENCOUNTER — Ambulatory Visit (HOSPITAL_COMMUNITY): Payer: 59

## 2021-10-14 ENCOUNTER — Other Ambulatory Visit: Payer: Self-pay | Admitting: Orthopedic Surgery

## 2021-10-14 ENCOUNTER — Ambulatory Visit (HOSPITAL_COMMUNITY): Payer: Medicaid Other

## 2021-10-14 ENCOUNTER — Other Ambulatory Visit (HOSPITAL_COMMUNITY): Payer: Self-pay | Admitting: Orthopedic Surgery

## 2021-10-14 DIAGNOSIS — T1590XA Foreign body on external eye, part unspecified, unspecified eye, initial encounter: Secondary | ICD-10-CM

## 2021-10-14 DIAGNOSIS — M5412 Radiculopathy, cervical region: Secondary | ICD-10-CM

## 2021-11-11 ENCOUNTER — Ambulatory Visit (HOSPITAL_COMMUNITY)
Admission: RE | Admit: 2021-11-11 | Discharge: 2021-11-11 | Disposition: A | Discharge status: Home / Self Care | Payer: No Typology Code available for payment source | Source: Ambulatory Visit | Attending: Orthopedic Surgery | Admitting: Orthopedic Surgery

## 2021-11-11 DIAGNOSIS — M5412 Radiculopathy, cervical region: Secondary | ICD-10-CM | POA: Diagnosis present

## 2024-02-08 IMAGING — MR MR CERVICAL SPINE W/O CM
5 series · 39 of 48 positions shown · non-contrast
Comparison: None Available.

CLINICAL DATA: Cervical radiculopathy KT2.PS ([id number]).

EXAM:
MRI CERVICAL SPINE WITHOUT CONTRAST
TECHNIQUE: Multiplanar, multisequence MR imaging of the cervical spine was
performed. No intravenous contrast was administered.

[Series 5: T2 · sagittal · 3.0mm · 0.69mm/px · 6 of 15 slices shown (1 of 2)]
[im 1/15]
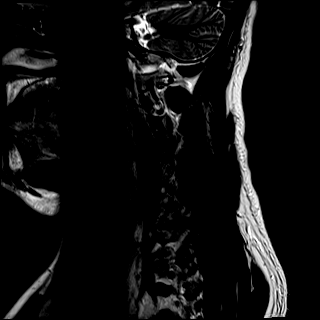
[im 3/15]
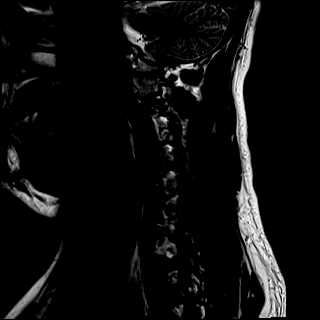
[im 6/15]
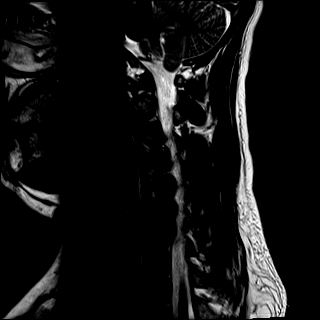
[im 9/15]
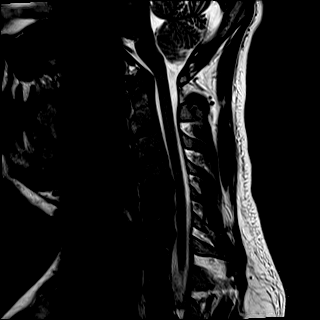
[im 12/15]
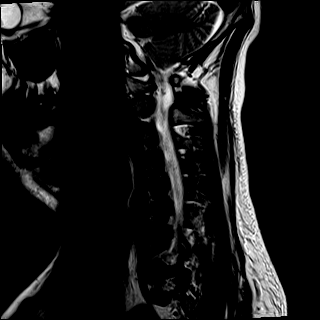
[im 15/15]
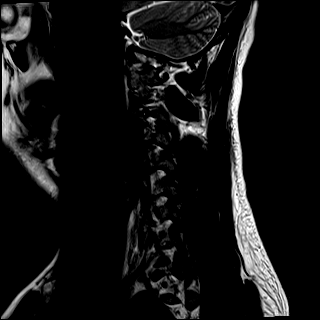

[Series 6: T1 · sagittal · 3.0mm · 0.86mm/px · 6 of 15 slices shown]
[im 1/15]
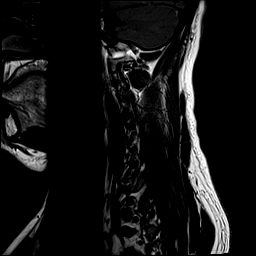
[im 3/15]
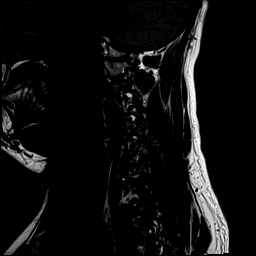
[im 6/15]
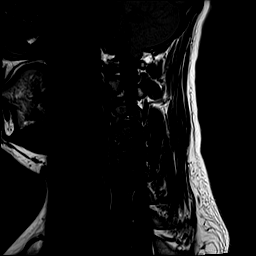
[im 9/15]
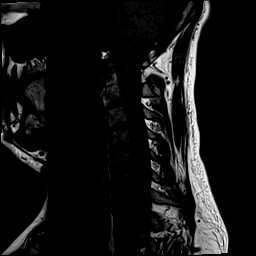
[im 12/15]
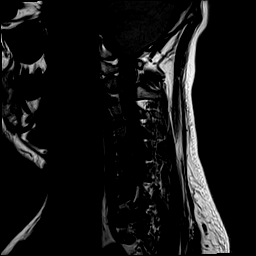
[im 15/15]
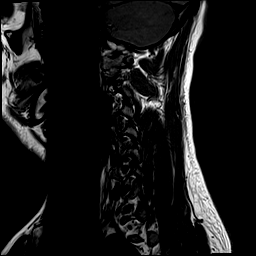

[Series 7: STIR · sagittal · 3.0mm · 0.69mm/px · 6 of 15 slices shown]
[im 1/15]
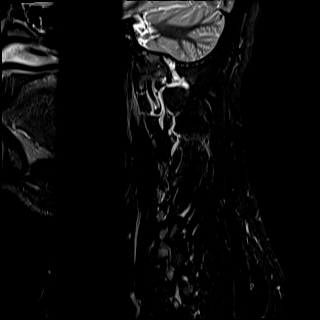
[im 3/15]
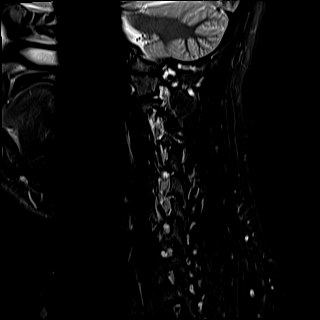
[im 6/15]
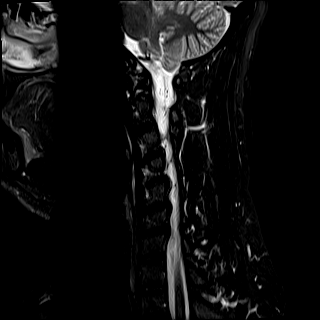
[im 9/15]
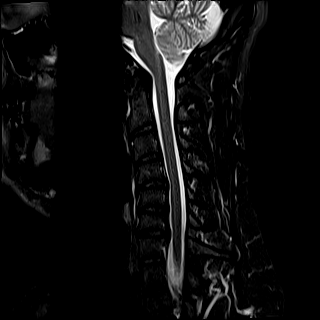
[im 12/15]
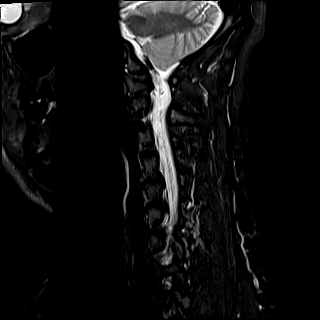
[im 15/15]
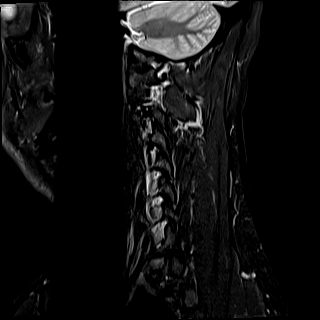

[Series 8: T2 · axial · 3.0mm · 0.70mm/px · z∈[-20,+105]mm · 13 of 40 slices shown (2 of 2)]
[im 1/40]
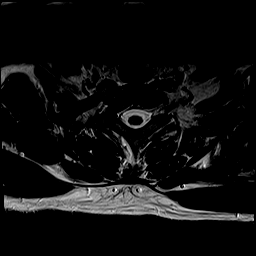
[im 3/40]
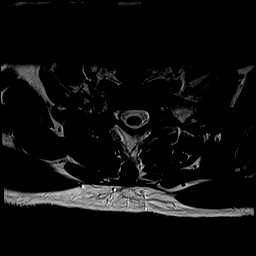
[im 6/40]
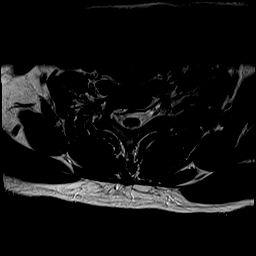
[im 9/40]
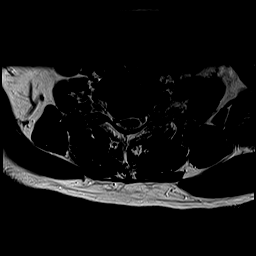
[im 12/40]
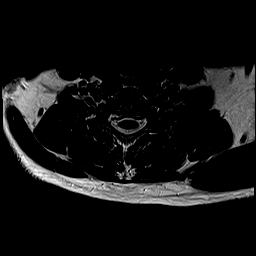
[im 14/40]
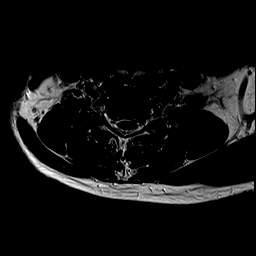
[im 17/40]
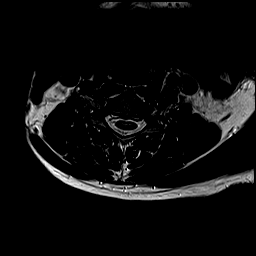
[im 20/40]
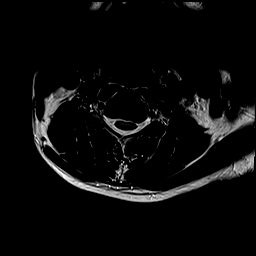
[im 23/40]
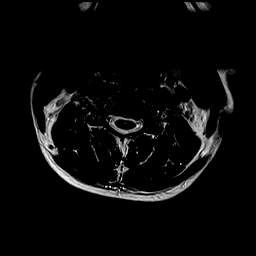
[im 26/40]
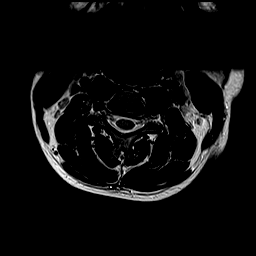
[im 28/40]
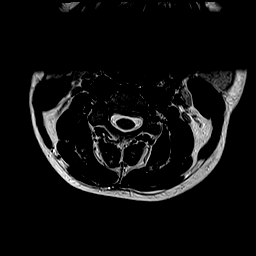
[im 34/40]
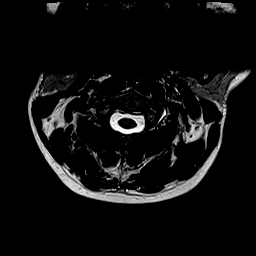
[im 40/40]
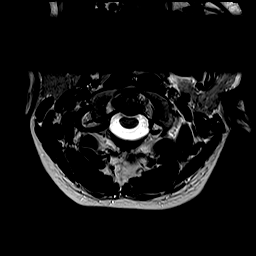

[Series 9: GRE · axial · 3.0mm · 0.35mm/px · z∈[-20,+105]mm · 8 of 40 slices shown]
[im 1/40]
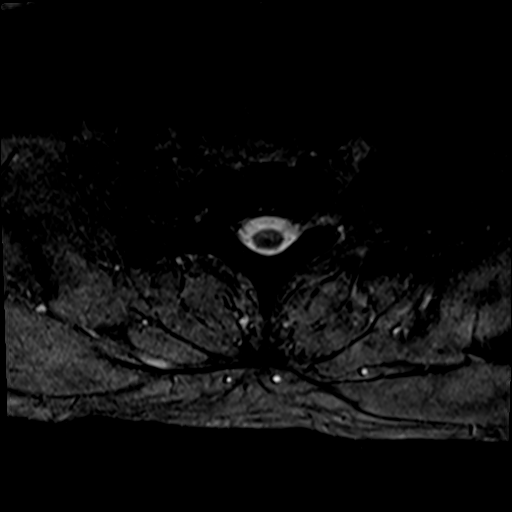
[im 6/40]
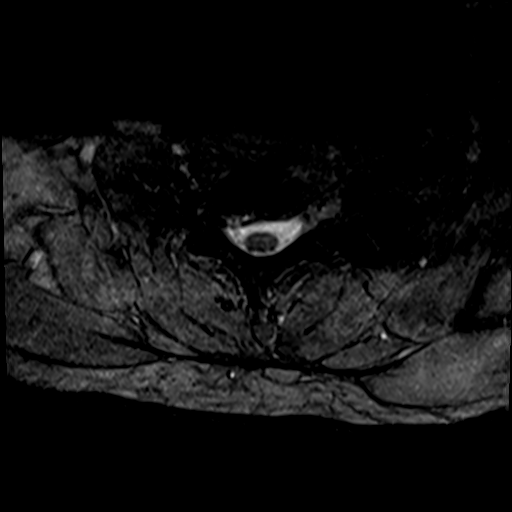
[im 12/40]
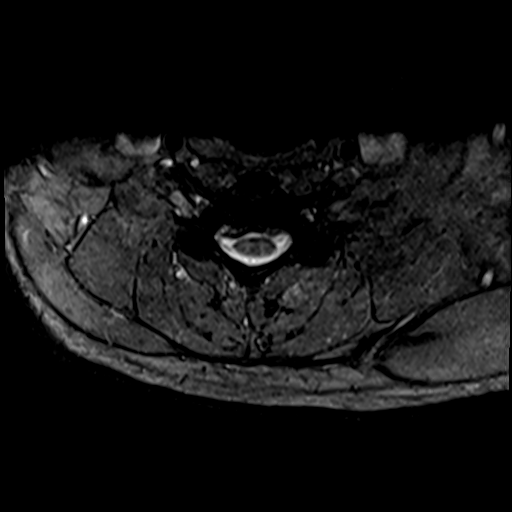
[im 17/40]
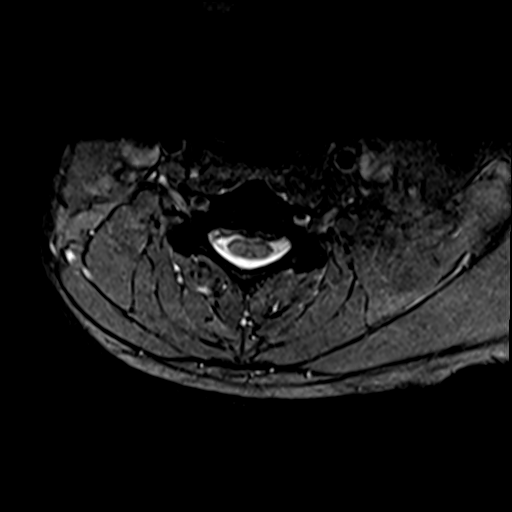
[im 23/40]
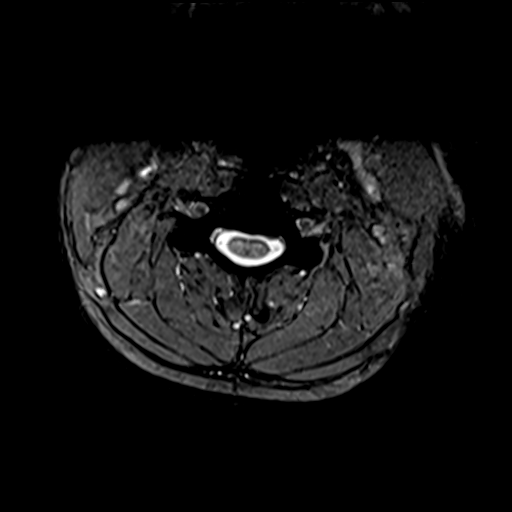
[im 28/40]
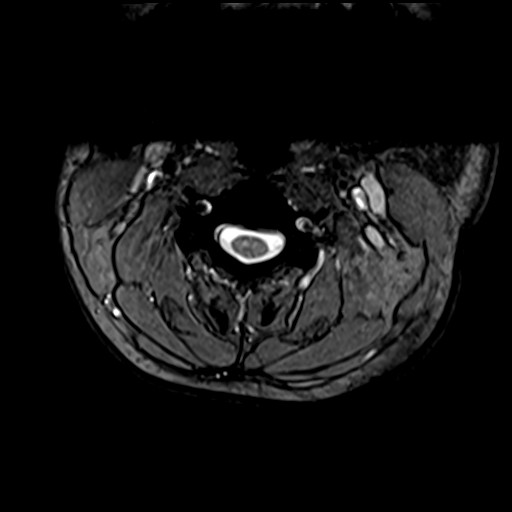
[im 34/40]
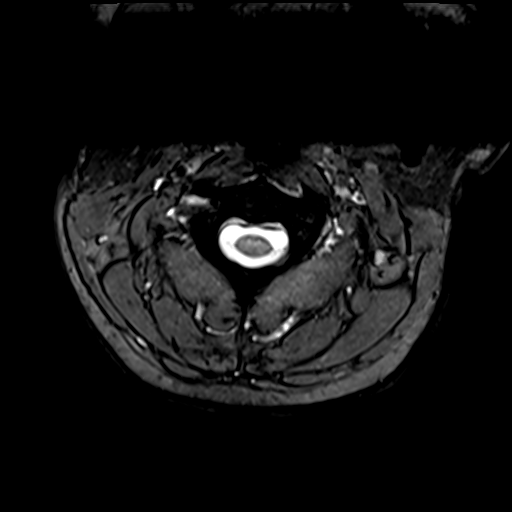
[im 40/40]
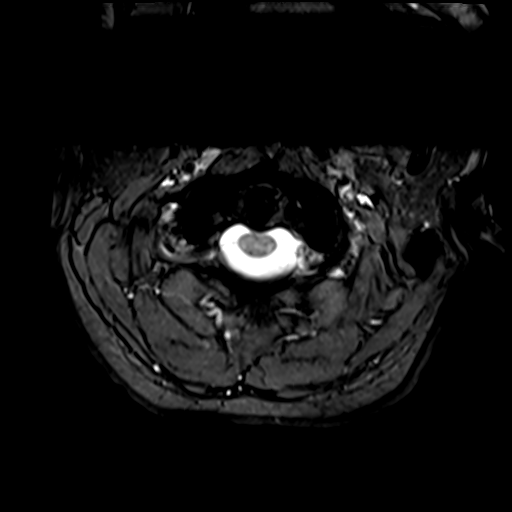

[39 of 48 positions shown; findings below may reference images not displayed]

FINDINGS: Alignment: Mild reversal of the cervical curvature.

Vertebrae: No fracture, evidence of discitis, or bone lesion.

Cord: Normal signal and morphology.

Posterior Fossa, vertebral arteries, paraspinal tissues: Negative.

Disc levels:

C2-3: No spinal canal or neural foraminal stenosis.

C3-4: No spinal canal or neural foraminal stenosis.

C4-5: Small posterior disc osteophyte complex causing mild
indentation of the thecal sac without significant spinal canal
stenosis. Uncovertebral and facet degenerative changes resulting in
moderate right and mild left neural foraminal narrowing.

C5-6: Minimal posterior disc osteophyte complex without significant
spinal canal stenosis. Mild uncovertebral degenerative changes
resulting in mild right neural foraminal narrowing.

C6-7: Posterior disc osteophyte complex mildly asymmetric to the
right resulting in mild spinal canal stenosis. Uncovertebral and
facet degenerative changes resulting in mild bilateral neural
foraminal narrowing.

C7-T1: No spinal canal or neural foraminal stenosis.
IMPRESSION: 1. Mild degenerative changes of the cervical spine with mild spinal
canal stenosis at C6-7. No high-grade spinal canal stenosis at any
level.
2. Moderate right and mild left neural foraminal narrowing at C4-5.
3. Mild right neural foraminal narrowing at C5-6.
4. Mild bilateral neural foraminal narrowing at C6-7.
